# Patient Record
Sex: Male | Born: 1950 | Race: White | Hispanic: No | Marital: Single | Smoking: Former smoker
Health system: Southern US, Community
[De-identification: ages and names within clinical notes are randomized; demographics above are authoritative.]

## PROBLEM LIST (undated history)

## (undated) DIAGNOSIS — J309 Allergic rhinitis, unspecified: Secondary | ICD-10-CM

## (undated) DIAGNOSIS — T7840XA Allergy, unspecified, initial encounter: Secondary | ICD-10-CM

## (undated) DIAGNOSIS — C61 Malignant neoplasm of prostate: Secondary | ICD-10-CM

## (undated) DIAGNOSIS — R972 Elevated prostate specific antigen [PSA]: Secondary | ICD-10-CM

## (undated) DIAGNOSIS — C44311 Basal cell carcinoma of skin of nose: Secondary | ICD-10-CM

## (undated) DIAGNOSIS — K219 Gastro-esophageal reflux disease without esophagitis: Secondary | ICD-10-CM

## (undated) DIAGNOSIS — M5030 Other cervical disc degeneration, unspecified cervical region: Secondary | ICD-10-CM

## (undated) DIAGNOSIS — E559 Vitamin D deficiency, unspecified: Secondary | ICD-10-CM

## (undated) DIAGNOSIS — E669 Obesity, unspecified: Secondary | ICD-10-CM

## (undated) DIAGNOSIS — I10 Essential (primary) hypertension: Secondary | ICD-10-CM

## (undated) DIAGNOSIS — Z87891 Personal history of nicotine dependence: Secondary | ICD-10-CM

## (undated) DIAGNOSIS — F419 Anxiety disorder, unspecified: Secondary | ICD-10-CM

## (undated) DIAGNOSIS — E782 Mixed hyperlipidemia: Secondary | ICD-10-CM

## (undated) DIAGNOSIS — M5412 Radiculopathy, cervical region: Secondary | ICD-10-CM

## (undated) DIAGNOSIS — G610 Guillain-Barre syndrome: Secondary | ICD-10-CM

## (undated) DIAGNOSIS — R202 Paresthesia of skin: Secondary | ICD-10-CM

## (undated) HISTORY — DX: Elevated prostate specific antigen (PSA): R97.20

## (undated) HISTORY — DX: Essential (primary) hypertension: I10

## (undated) HISTORY — PX: TONSILLECTOMY: SUR1361

## (undated) HISTORY — DX: Guillain-Barre syndrome: G61.0

## (undated) HISTORY — DX: Basal cell carcinoma of skin of nose: C44.311

## (undated) HISTORY — DX: Gastro-esophageal reflux disease without esophagitis: K21.9

## (undated) HISTORY — DX: Personal history of nicotine dependence: Z87.891

## (undated) HISTORY — DX: Malignant neoplasm of prostate: C61

## (undated) HISTORY — DX: Allergic rhinitis, unspecified: J30.9

## (undated) HISTORY — DX: Allergy, unspecified, initial encounter: T78.40XA

## (undated) HISTORY — DX: Paresthesia of skin: R20.2

## (undated) HISTORY — DX: Radiculopathy, cervical region: M54.12

## (undated) HISTORY — DX: Other cervical disc degeneration, unspecified cervical region: M50.30

## (undated) HISTORY — DX: Obesity, unspecified: E66.9

## (undated) HISTORY — DX: Vitamin D deficiency, unspecified: E55.9

## (undated) HISTORY — DX: Mixed hyperlipidemia: E78.2

## (undated) HISTORY — DX: Anxiety disorder, unspecified: F41.9

---

## 1993-10-31 DIAGNOSIS — G610 Guillain-Barre syndrome: Secondary | ICD-10-CM

## 1993-10-31 HISTORY — DX: Guillain-Barre syndrome: G61.0

## 2003-05-26 ENCOUNTER — Encounter: Payer: Self-pay | Admitting: Internal Medicine

## 2003-05-26 ENCOUNTER — Ambulatory Visit (HOSPITAL_COMMUNITY): Admission: RE | Admit: 2003-05-26 | Discharge: 2003-05-26 | Payer: Self-pay | Admitting: Internal Medicine

## 2004-10-31 HISTORY — PX: COLONOSCOPY: SHX174

## 2009-11-13 ENCOUNTER — Ambulatory Visit: Payer: Self-pay | Admitting: Internal Medicine

## 2009-11-13 DIAGNOSIS — J309 Allergic rhinitis, unspecified: Secondary | ICD-10-CM | POA: Insufficient documentation

## 2009-11-13 DIAGNOSIS — I10 Essential (primary) hypertension: Secondary | ICD-10-CM | POA: Insufficient documentation

## 2009-11-13 DIAGNOSIS — K219 Gastro-esophageal reflux disease without esophagitis: Secondary | ICD-10-CM | POA: Insufficient documentation

## 2009-11-13 DIAGNOSIS — E7800 Pure hypercholesterolemia, unspecified: Secondary | ICD-10-CM

## 2009-11-13 LAB — CONVERTED CEMR LAB
ALT: 33 units/L (ref 0–53)
Albumin: 4.3 g/dL (ref 3.5–5.2)
Alkaline Phosphatase: 40 units/L (ref 39–117)
HDL goal, serum: 40 mg/dL
LDL Cholesterol: 105 mg/dL — ABNORMAL HIGH (ref 0–99)
Total CHOL/HDL Ratio: 4
VLDL: 10.8 mg/dL (ref 0.0–40.0)

## 2009-11-16 ENCOUNTER — Encounter: Payer: Self-pay | Admitting: Internal Medicine

## 2009-11-17 ENCOUNTER — Telehealth (INDEPENDENT_AMBULATORY_CARE_PROVIDER_SITE_OTHER): Payer: Self-pay | Admitting: *Deleted

## 2010-05-07 ENCOUNTER — Telehealth: Payer: Self-pay | Admitting: Internal Medicine

## 2010-06-14 ENCOUNTER — Ambulatory Visit: Payer: Self-pay | Admitting: Internal Medicine

## 2010-06-14 LAB — CONVERTED CEMR LAB
ALT: 29 units/L (ref 0–53)
AST: 21 units/L (ref 0–37)
Albumin: 4.1 g/dL (ref 3.5–5.2)
Alkaline Phosphatase: 45 units/L (ref 39–117)
BUN: 12 mg/dL (ref 6–23)
Basophils Absolute: 0 10*3/uL (ref 0.0–0.1)
Basophils Relative: 0.4 % (ref 0.0–3.0)
CO2: 28 meq/L (ref 19–32)
Cholesterol: 162 mg/dL (ref 0–200)
HDL: 33.6 mg/dL — ABNORMAL LOW (ref >39.00)
Hemoglobin: 14.8 g/dL (ref 13.0–17.0)
MCHC: 35.5 g/dL (ref 30.0–36.0)
MCV: 88.8 fL (ref 78.0–100.0)
Platelets: 197 10*3/uL (ref 150.0–400.0)
Potassium: 4.3 meq/L (ref 3.5–5.1)
RBC: 4.71 M/uL (ref 4.22–5.81)
RDW: 12.7 % (ref 11.5–14.6)
Sodium: 139 meq/L (ref 135–145)
TSH: 0.98 microintl units/mL (ref 0.35–5.50)
Total Bilirubin: 0.6 mg/dL (ref 0.3–1.2)
Total CHOL/HDL Ratio: 5
Triglycerides: 209 mg/dL — ABNORMAL HIGH (ref 0.0–149.0)

## 2010-11-08 ENCOUNTER — Telehealth: Payer: Self-pay | Admitting: Internal Medicine

## 2010-12-02 NOTE — Assessment & Plan Note (Signed)
Summary: NEW / [employer] / # / CD   Vital Signs:  Patient profile:   60 year old male Height:      71 inches Weight:      219 pounds BMI:     30.65 O2 Sat:      96 % on Room air Temp:     97.2 degrees F oral Pulse rate:   68 / minute Pulse rhythm:   regular Resp:     16 per minute BP sitting:   126 / 82  (left arm) Cuff size:   large  Vitals Entered By: Rock Nephew CMA (November 13, 2009 9:06 AM)  Nutrition Counseling: Patient's BMI is greater than 25 and therefore counseled on weight management options.  O2 Flow:  Room air  Primary Care Provider:  Etta Grandchild MD   History of Present Illness: "Jesus Burke" is new to me and wants to establish primary care. He had a complete physical with Dr. Earlean Polka in [place] 6 months ago.  Hypertension History:      He denies headache, chest pain, palpitations, dyspnea with exertion, orthopnea, peripheral edema, visual symptoms, neurologic problems, syncope, and side effects from treatment.  He notes no problems with any antihypertensive medication side effects.        Positive major cardiovascular risk factors include male age 10 years old or older, hyperlipidemia, and hypertension.  Negative major cardiovascular risk factors include negative family history for ischemic heart disease and non-tobacco-user status.        Further assessment for target organ damage reveals no history of ASHD, cardiac end-organ damage (CHF/LVH), stroke/TIA, peripheral vascular disease, renal insufficiency, or hypertensive retinopathy.    Lipid Management History:      Positive NCEP/ATP III risk factors include male age 15 years old or older and hypertension.  Negative NCEP/ATP III risk factors include no family history for ischemic heart disease, non-tobacco-user status, no ASHD (atherosclerotic heart disease), no prior stroke/TIA, no peripheral vascular disease, and no history of aortic aneurysm.        The patient states that he knows about the "Therapeutic Lifestyle  Change" diet.  His compliance with the TLC diet is fair.  The patient expresses understanding of adjunctive measures for cholesterol lowering.  Adjunctive measures started by the patient include aerobic exercise, fiber, ASA, omega-3 supplements, limit alcohol consumpton, and weight reduction.  He expresses no side effects from his lipid-lowering medication.  The patient denies any symptoms to suggest myopathy or liver disease.      Preventive Screening-Counseling & Management  Alcohol-Tobacco     Alcohol drinks/day: <1     Alcohol type: spirits     >5/day in last 3 mos: no     Alcohol Counseling: not indicated; use of alcohol is not excessive or problematic     Feels need to cut down: no     Feels annoyed by complaints: no     Feels guilty re: drinking: no     Needs 'eye opener' in am: no     Smoking Status: quit     Year Quit: 1985  Caffeine-Diet-Exercise     Does Patient Exercise: yes  Hep-HIV-STD-Contraception     Hepatitis Risk: no risk noted     HIV Risk: no risk noted     STD Risk: no risk noted     TSE monthly: yes     Testicular SE Education/Counseling to perform regular STE      Drug Use:  no.  Blood Transfusions:  no.    Clinical Review Panels:  Prevention   Last Colonoscopy:  Normal (11/15/2005)   Current Medications (verified): 1)  Atenolol 50 Mg Tabs (Atenolol) .... Take 1 Tablet By Mouth Two Times A Day 2)  Clorazepate Dipotassium 7.5 Mg Tabs (Clorazepate Dipotassium) .... Take 1 Tablet By Mouth Once A Day 3)  Simvastatin 40 Mg Tabs (Simvastatin) .... Take 1 Tablet By Mouth Once A Day 4)  L-Lysine 500 Mg Tabs (Lysine) .... Take 1 Tablet By Mouth Once A Day 5)  Fish Oil 6)  Omega-3 7)  Coq-10 400mg  8)  Asa 81mg  .... Take 1 Tablet By Mouth Once A Day 9)  Mature Multivitamin 10)  Vitamin C 11)  Vitamin D-3 2000 Iu .... 2 Once Daily  Allergies (verified): 1)  ! * Flu Vaccination  Past History:  Past Medical History: Allergic  rhinitis GERD Hypertension Guillian-Barre Syndrome in 1995  Past Surgical History: Tonsillectomy  Family History: Family History of Arthritis Family History High cholesterol  Social History: Retired: Optician Single Alcohol use-no Drug use-no Regular exercise-yes Smoking Status:  quit Hepatitis Risk:  no risk noted HIV Risk:  no risk noted STD Risk:  no risk noted Blood Transfusions:  no Drug Use:  no Does Patient Exercise:  yes  Review of Systems       The patient complains of weight gain.  The patient denies chest pain, syncope, dyspnea on exertion, peripheral edema, prolonged cough, headaches, hemoptysis, abdominal pain, melena, hematochezia, severe indigestion/heartburn, suspicious skin lesions, difficulty walking, and depression.    Physical Exam  General:  alert, well-developed, well-nourished, well-hydrated, appropriate dress, normal appearance, healthy-appearing, and cooperative to examination.   Eyes:  No icterus, pink moist mm Mouth:  Oral mucosa and oropharynx without lesions or exudates.  Teeth in good repair. Neck:  supple, full ROM, no masses, no thyromegaly, no JVD, normal carotid upstroke, no carotid bruits, and no cervical lymphadenopathy.   Lungs:  normal respiratory effort, no intercostal retractions, no accessory muscle use, normal breath sounds, no dullness, no fremitus, no crackles, and no wheezes.   Heart:  normal rate, regular rhythm, no murmur, no gallop, no rub, and no JVD.   Abdomen:  soft, non-tender, normal bowel sounds, no distention, no masses, no guarding, no rigidity, no hepatomegaly, and no splenomegaly.   Msk:  normal ROM, no joint tenderness, no joint swelling, no joint warmth, no redness over joints, no joint deformities, no joint instability, and no crepitation.   Pulses:  R and L carotid,radial,femoral,dorsalis pedis and posterior tibial pulses are full and equal bilaterally Extremities:  No clubbing, cyanosis, edema, or deformity noted  with normal full range of motion of all joints.   Neurologic:  No cranial nerve deficits noted. Station and gait are normal. Plantar reflexes are down-going bilaterally. DTRs are symmetrical throughout. Sensory, motor and coordinative functions appear intact. Skin:  Intact without suspicious lesions or rashes Cervical Nodes:  no anterior cervical adenopathy and no posterior cervical adenopathy.   Axillary Nodes:  no R axillary adenopathy and no L axillary adenopathy.   Inguinal Nodes:  no R inguinal adenopathy and no L inguinal adenopathy.   Psych:  Cognition and judgment appear intact. Alert and cooperative with normal attention span and concentration. No apparent delusions, illusions, hallucinations   Impression & Recommendations:  Problem # 1:  HYPERTENSION (ICD-401.9) Assessment Improved  His updated medication list for this problem includes:    Atenolol 50 Mg Tabs (Atenolol) .Marland Kitchen... Take 1 tablet by mouth  two times a day  BP today: 126/82  10 Yr Risk Heart Disease: Not enough information  Problem # 2:  PURE HYPERCHOLESTEROLEMIA (ICD-272.0) Assessment: Unchanged  His updated medication list for this problem includes:    Simvastatin 40 Mg Tabs (Simvastatin) .Marland Kitchen... Take 1 tablet by mouth once a day  Orders: Venipuncture (06237) TLB-Lipid Panel (80061-LIPID) TLB-Hepatic/Liver Function Pnl (80076-HEPATIC)  Lipid Goals: Chol Goal: 200 (11/13/2009)   HDL Goal: 40 (11/13/2009)   LDL Goal: 130 (11/13/2009)   TG Goal: 150 (11/13/2009)  10 Yr Risk Heart Disease: Not enough information  Complete Medication List: 1)  Atenolol 50 Mg Tabs (Atenolol) .... Take 1 tablet by mouth two times a day 2)  Clorazepate Dipotassium 7.5 Mg Tabs (Clorazepate dipotassium) .... Take 1 tablet by mouth once a day 3)  Simvastatin 40 Mg Tabs (Simvastatin) .... Take 1 tablet by mouth once a day 4)  L-lysine 500 Mg Tabs (Lysine) .... Take 1 tablet by mouth once a day 5)  Fish Oil  6)  Omega-3  7)  Coq-10  400mg   8)  Asa 81mg   .... Take 1 tablet by mouth once a day 9)  Mature Multivitamin  10)  Vitamin C  11)  Vitamin D-3 2000 Iu  .... 2 once daily  Hypertension Assessment/Plan:      The patient's hypertensive risk group is category B: At least one risk factor (excluding diabetes) with no target organ damage.  Today's blood pressure is 126/82.  His blood pressure goal is < 140/90.  Lipid Assessment/Plan:      Based on NCEP/ATP III, the patient's risk factor category is "2 or more risk factors and a calculated 10 year CAD risk of > 20%".  The patient's lipid goals are as follows: Total cholesterol goal is 200; LDL cholesterol goal is 130; HDL cholesterol goal is 40; Triglyceride goal is 150.    Colorectal Screening:  Colonoscopy Results:    Date of Exam: 11/15/2005    Results: Normal  PSA Screening:    Reviewed PSA screening recommendations: patient defers PSA but will reconsider in the future  Patient Instructions: 1)  Please schedule a follow-up appointment in 6 months. 2)  It is important that you exercise regularly at least 20 minutes 5 times a week. If you develop chest pain, have severe difficulty breathing, or feel very tired , stop exercising immediately and seek medical attention. 3)  You need to lose weight. Consider a lower calorie diet and regular exercise.  4)  Check your Blood Pressure regularly. If it is above 140/90: you should make an appointment. Prescriptions: CLORAZEPATE DIPOTASSIUM 7.5 MG TABS (CLORAZEPATE DIPOTASSIUM) Take 1 tablet by mouth once a day  #90 x 1   Entered and Authorized by:   Etta Grandchild MD   Signed by:   Etta Grandchild MD on 11/13/2009   Method used:   Print then Give to Patient   RxID:   [id number]     Immunization History:  Zostavax History:    Zostavax # 1:  zostavax (10/31/2006)  Not Administered:    Influenza Vaccine not given due to: patient condition

## 2010-12-02 NOTE — Letter (Signed)
Summary: Lipid Letter  [facility]  [address] [place], [place] [address]   Phone: [phone]  Fax: [phone]    06/14/2010  Jesus Burke [address] [place], [place]  [address]  Dear [name]:  We have carefully reviewed your last lipid profile from 11/13/2009 and the results are noted below with a summary of recommendations for lipid management.    Cholesterol:       162     Goal: <200   HDL "good" Cholesterol:   32.44     Goal: >40   LDL "bad" Cholesterol:   110     Goal: <130   Triglycerides:       209.0     Goal: <150 yikes!!!    other labs look good    TLC Diet (Therapeutic Lifestyle Change): Saturated Fats & Transfatty acids should be kept < 7% of total calories ***Reduce Saturated Fats Polyunstaurated Fat can be up to 10% of total calories Monounsaturated Fat Fat can be up to 20% of total calories Total Fat should be no greater than 25-35% of total calories Carbohydrates should be 50-60% of total calories Protein should be approximately 15% of total calories Fiber should be at least 20-30 grams a day ***Increased fiber may help lower LDL Total Cholesterol should be < 200mg /day Consider adding plant stanol/sterols to diet (example: Benacol spread) ***A higher intake of unsaturated fat may reduce Triglycerides and Increase HDL    Adjunctive Measures (may lower LIPIDS and reduce risk of Heart Attack) include: Aerobic Exercise (20-30 minutes 3-4 times a week) Limit Alcohol Consumption Weight Reduction Aspirin 75-81 mg a day by mouth (if not allergic or contraindicated) Dietary Fiber 20-30 grams a day by mouth     Current Medications: 1)    Atenolol 50 Mg Tabs (Atenolol) .... Take 1 tablet by mouth two times a day 2)    Clorazepate Dipotassium 7.5 Mg Tabs (Clorazepate dipotassium) .... Take 1 tablet by mouth once a day 3)    Simvastatin 40 Mg Tabs (Simvastatin) .... Take 1 tablet by mouth once a day 4)    L-lysine 500 Mg Tabs (Lysine) ....  Take 1 tablet by mouth once a day 5)    Fish Oil  6)    Omega-3  7)    Coq-10 400mg   8)    Asa 81mg   .... Take 1 tablet by mouth once a day 9)    Mature Multivitamin  10)    Vitamin C  11)    Vitamin D-3 2000 Iu  .... 2 once daily 12)    Prevacid 24hr 15 Mg Cpdr (Lansoprazole) .... Once daily  If you have any questions, please call. We appreciate being able to work with you.   Sincerely,    [facility] Etta Grandchild MD

## 2010-12-02 NOTE — Progress Notes (Signed)
Summary: RF  Phone Note Refill Request Message from:  Pharmacy  Refills Requested: Medication #1:  CLORAZEPATE DIPOTASSIUM 7.5 MG TABS Take 1 tablet by mouth once a day   Supply Requested: 6 months OK?   Initial call taken by: Lamar Sprinkles, CMA,  November 08, 2010 9:22 AM  Follow-up for Phone Call        yes Follow-up by: Etta Grandchild MD,  November 08, 2010 10:22 AM    Prescriptions: CLORAZEPATE DIPOTASSIUM 7.5 MG TABS (CLORAZEPATE DIPOTASSIUM) Take 1 tablet by mouth once a day  #90 x 1   Entered by:   Lamar Sprinkles, CMA   Authorized by:   Etta Grandchild MD   Signed by:   Lamar Sprinkles, CMA on 11/08/2010   Method used:   Telephoned to ...       [facility]* (retail)       [address] [place], [place]  [address]       Ph: [phone]       Fax: [phone]   RxID:   [id number]

## 2010-12-02 NOTE — Letter (Signed)
Summary: Lipid Letter  [facility]  [address] [place], [place] [address]   Phone: [phone]  Fax: [phone]    11/16/2009  Amiir Heckard [address] [place], [place]  [address]  Dear [name]:  We have carefully reviewed your last lipid profile from 11/13/2009 and the results are noted below with a summary of recommendations for lipid management.    Cholesterol:       153     Goal: <200   HDL "good" Cholesterol:   53.66     Goal: >40   LDL "bad" Cholesterol:   105     Goal: <130   Triglycerides:       54.0     Goal: <150    liver enzymes are normal    TLC Diet (Therapeutic Lifestyle Change): Saturated Fats & Transfatty acids should be kept < 7% of total calories ***Reduce Saturated Fats Polyunstaurated Fat can be up to 10% of total calories Monounsaturated Fat Fat can be up to 20% of total calories Total Fat should be no greater than 25-35% of total calories Carbohydrates should be 50-60% of total calories Protein should be approximately 15% of total calories Fiber should be at least 20-30 grams a day ***Increased fiber may help lower LDL Total Cholesterol should be < 200mg /day Consider adding plant stanol/sterols to diet (example: Benacol spread) ***A higher intake of unsaturated fat may reduce Triglycerides and Increase HDL    Adjunctive Measures (may lower LIPIDS and reduce risk of Heart Attack) include: Aerobic Exercise (20-30 minutes 3-4 times a week) Limit Alcohol Consumption Weight Reduction Aspirin 75-81 mg a day by mouth (if not allergic or contraindicated) Dietary Fiber 20-30 grams a day by mouth     Current Medications: 1)    Atenolol 50 Mg Tabs (Atenolol) .... Take 1 tablet by mouth two times a day 2)    Clorazepate Dipotassium 7.5 Mg Tabs (Clorazepate dipotassium) .... Take 1 tablet by mouth once a day 3)    Simvastatin 40 Mg Tabs (Simvastatin) .... Take 1 tablet by mouth once a day 4)    L-lysine 500 Mg Tabs (Lysine) .... Take 1  tablet by mouth once a day 5)    Fish Oil  6)    Omega-3  7)    Coq-10 400mg   8)    Asa 81mg   .... Take 1 tablet by mouth once a day 9)    Mature Multivitamin  10)    Vitamin C  11)    Vitamin D-3 2000 Iu  .... 2 once daily  If you have any questions, please call. We appreciate being able to work with you.   Sincerely,    [facility] Etta Grandchild MD

## 2010-12-02 NOTE — Assessment & Plan Note (Signed)
Summary: f/u appt/#/cd   Vital Signs:  Patient profile:   60 year old male Height:      71 inches Weight:      222 pounds BMI:     31.07 O2 Sat:      97 % on Room air Temp:     97.3 degrees F oral Pulse rate:   72 / minute Pulse rhythm:   regular Resp:     16 per minute BP sitting:   138 / 82  (left arm) Cuff size:   large  Vitals Entered By: Rock Nephew CMA (June 14, 2010 10:08 AM)  Nutrition Counseling: Patient's BMI is greater than 25 and therefore counseled on weight management options.  O2 Flow:  Room air CC: follow-up visit//med refill, Lipid Management Is Patient Diabetic? No Pain Assessment Patient in pain? no        Primary Care Hayes Rehfeldt:  Etta Grandchild MD  CC:  follow-up visit//med refill and Lipid Management.  History of Present Illness:  Follow-Up Visit      This is a 60 year old man who presents for Follow-up visit.  The patient denies chest pain, palpitations, dizziness, syncope, edema, SOB, DOE, PND, and orthopnea.  Since the last visit the patient notes no new problems or concerns.  The patient reports taking meds as prescribed, monitoring BP, and dietary noncompliance.  When questioned about possible medication side effects, the patient notes none.    Dyspepsia History:      He has no alarm features of dyspepsia including no history of melena, hematochezia, dysphagia, persistent vomiting, or involuntary weight loss > 5%.  There is a prior history of GERD.  The patient does not have a prior history of documented ulcer disease.  The dominant symptom is heartburn or acid reflux.  An H-2 blocker medication is currently being taken.  He notes that the symptoms have improved with the H-2 blocker therapy.  Symptoms have not persisted after 4 weeks of H-2 blocker treatment.    Lipid Management History:      Positive NCEP/ATP III risk factors include male age 58 years old or older, HDL cholesterol less than 40, and hypertension.  Negative NCEP/ATP III risk  factors include no family history for ischemic heart disease, non-tobacco-user status, no ASHD (atherosclerotic heart disease), no prior stroke/TIA, no peripheral vascular disease, and no history of aortic aneurysm.        The patient states that he knows about the "Therapeutic Lifestyle Change" diet.  His compliance with the TLC diet is fair.  The patient expresses understanding of adjunctive measures for cholesterol lowering.  Adjunctive measures started by the patient include aerobic exercise, fiber, ASA, omega-3 supplements, limit alcohol consumpton, and weight reduction.  He expresses no side effects from his lipid-lowering medication.  The patient denies any symptoms to suggest myopathy or liver disease.      Preventive Screening-Counseling & Management  Alcohol-Tobacco     Alcohol drinks/day: <1     Alcohol type: spirits     >5/day in last 3 mos: no     Alcohol Counseling: not indicated; use of alcohol is not excessive or problematic     Feels need to cut down: no     Feels annoyed by complaints: no     Feels guilty re: drinking: no     Needs 'eye opener' in am: no     Smoking Status: quit     Year Quit: 1985     Tobacco Counseling: to remain  off tobacco products  Caffeine-Diet-Exercise     Does Patient Exercise: yes  Hep-HIV-STD-Contraception     Hepatitis Risk: no risk noted     HIV Risk: no risk noted     STD Risk: no risk noted     TSE monthly: yes     Testicular SE Education/Counseling to perform regular STE      Drug Use:  no.        Blood Transfusions:  no.    Clinical Review Panels:  Lipid Management   Cholesterol:  153 (11/13/2009)   LDL (bad choesterol):  105 (11/13/2009)   HDL (good cholesterol):  37.50 (11/13/2009)  Complete Metabolic Panel   Albumin:  4.3 (11/13/2009)   Total Protein:  6.9 (11/13/2009)   Total Bili:  0.5 (11/13/2009)   Alk Phos:  40 (11/13/2009)   SGPT (ALT):  33 (11/13/2009)   SGOT (AST):  26 (11/13/2009)   Medications Prior to  Update: 1)  Atenolol 50 Mg Tabs (Atenolol) .... Take 1 Tablet By Mouth Two Times A Day 2)  Clorazepate Dipotassium 7.5 Mg Tabs (Clorazepate Dipotassium) .... Take 1 Tablet By Mouth Once A Day 3)  Simvastatin 40 Mg Tabs (Simvastatin) .... Take 1 Tablet By Mouth Once A Day 4)  L-Lysine 500 Mg Tabs (Lysine) .... Take 1 Tablet By Mouth Once A Day 5)  Fish Oil 6)  Omega-3 7)  Coq-10 400mg  8)  Asa 81mg  .... Take 1 Tablet By Mouth Once A Day 9)  Mature Multivitamin 10)  Vitamin C 11)  Vitamin D-3 2000 Iu .... 2 Once Daily  Current Medications (verified): 1)  Atenolol 50 Mg Tabs (Atenolol) .... Take 1 Tablet By Mouth Two Times A Day 2)  Clorazepate Dipotassium 7.5 Mg Tabs (Clorazepate Dipotassium) .... Take 1 Tablet By Mouth Once A Day 3)  Simvastatin 40 Mg Tabs (Simvastatin) .... Take 1 Tablet By Mouth Once A Day 4)  L-Lysine 500 Mg Tabs (Lysine) .... Take 1 Tablet By Mouth Once A Day 5)  Fish Oil 6)  Omega-3 7)  Coq-10 400mg  8)  Asa 81mg  .... Take 1 Tablet By Mouth Once A Day 9)  Mature Multivitamin 10)  Vitamin C 11)  Vitamin D-3 2000 Iu .... 2 Once Daily  Allergies (verified): 1)  ! * Flu Vaccination  Past History:  Past Medical History: Last updated: 11/13/2009 Allergic rhinitis GERD Hypertension Guillian-Barre Syndrome in 1995  Past Surgical History: Last updated: 11/13/2009 Tonsillectomy  Family History: Last updated: 11/13/2009 Family History of Arthritis Family History High cholesterol  Social History: Last updated: 06/14/2010 occupation: Optician Single Alcohol use-no Drug use-no Regular exercise-yes  Risk Factors: Alcohol Use: <1 (06/14/2010) >5 drinks/d w/in last 3 months: no (06/14/2010) Exercise: yes (06/14/2010)  Risk Factors: Smoking Status: quit (06/14/2010)  Family History: Reviewed history from 11/13/2009 and no changes required. Family History of Arthritis Family History High cholesterol  Social History: Reviewed history from 11/13/2009  and no changes required. occupation: Optician Single Alcohol use-no Drug use-no Regular exercise-yes  Review of Systems  The patient denies anorexia, fever, weight loss, weight gain, chest pain, syncope, abdominal pain, melena, hematochezia, severe indigestion/heartburn, hematuria, suspicious skin lesions, difficulty walking, depression, angioedema, and testicular masses.    Physical Exam  General:  alert, well-developed, well-nourished, well-hydrated, appropriate dress, normal appearance, healthy-appearing, and cooperative to examination.   Head:  normocephalic, atraumatic, no abnormalities observed, and no abnormalities palpated.   Mouth:  Oral mucosa and oropharynx without lesions or exudates.  Teeth in good repair. Neck:  supple, full ROM, no masses, no thyromegaly, no JVD, normal carotid upstroke, no carotid bruits, and no cervical lymphadenopathy.   Lungs:  normal respiratory effort, no intercostal retractions, no accessory muscle use, normal breath sounds, no dullness, no fremitus, no crackles, and no wheezes.   Heart:  normal rate, regular rhythm, no murmur, no gallop, no rub, and no JVD.   Abdomen:  soft, non-tender, normal bowel sounds, no distention, no masses, no guarding, no rigidity, no hepatomegaly, and no splenomegaly.   Msk:  normal ROM, no joint tenderness, no joint swelling, no joint warmth, no redness over joints, no joint deformities, no joint instability, and no crepitation.   Pulses:  R and L carotid,radial,femoral,dorsalis pedis and posterior tibial pulses are full and equal bilaterally Extremities:  No clubbing, cyanosis, edema, or deformity noted with normal full range of motion of all joints.   Neurologic:  No cranial nerve deficits noted. Station and gait are normal. Plantar reflexes are down-going bilaterally. DTRs are symmetrical throughout. Sensory, motor and coordinative functions appear intact. Skin:  Intact without suspicious lesions or rashes Cervical  Nodes:  no anterior cervical adenopathy and no posterior cervical adenopathy.   Psych:  Cognition and judgment appear intact. Alert and cooperative with normal attention span and concentration. No apparent delusions, illusions, hallucinations   Impression & Recommendations:  Problem # 1:  HYPERTENSION (ICD-401.9) Assessment Improved  His updated medication list for this problem includes:    Atenolol 50 Mg Tabs (Atenolol) .Marland Kitchen... Take 1 tablet by mouth two times a day  Orders: Venipuncture (41324) TLB-Lipid Panel (80061-LIPID) TLB-CBC Platelet - w/Differential (85025-CBCD) TLB-Hepatic/Liver Function Pnl (80076-HEPATIC) TLB-TSH (Thyroid Stimulating Hormone) (84443-TSH) TLB-BMP (Basic Metabolic Panel-BMET) (80048-METABOL)  BP today: 138/82 Prior BP: 126/82 (11/13/2009)  10 Yr Risk Heart Disease: 14 % Prior 10 Yr Risk Heart Disease: Not enough information (11/13/2009)  Labs Reviewed: Chol: 153 (11/13/2009)   HDL: 37.50 (11/13/2009)   LDL: 105 (11/13/2009)   TG: 54.0 (11/13/2009)  Problem # 2:  GERD (ICD-530.81) Assessment: Improved  Orders: Venipuncture (40102) TLB-Lipid Panel (80061-LIPID) TLB-CBC Platelet - w/Differential (85025-CBCD) TLB-Hepatic/Liver Function Pnl (80076-HEPATIC) TLB-TSH (Thyroid Stimulating Hormone) (84443-TSH) TLB-BMP (Basic Metabolic Panel-BMET) (80048-METABOL)  His updated medication list for this problem includes:    Prevacid 24hr 15 Mg Cpdr (Lansoprazole) ..... Once daily  Problem # 3:  PURE HYPERCHOLESTEROLEMIA (ICD-272.0) Assessment: Unchanged  His updated medication list for this problem includes:    Simvastatin 40 Mg Tabs (Simvastatin) .Marland Kitchen... Take 1 tablet by mouth once a day  Orders: Venipuncture (72536) TLB-Lipid Panel (80061-LIPID) TLB-CBC Platelet - w/Differential (85025-CBCD) TLB-Hepatic/Liver Function Pnl (80076-HEPATIC) TLB-TSH (Thyroid Stimulating Hormone) (84443-TSH) TLB-BMP (Basic Metabolic Panel-BMET) (80048-METABOL)  Labs  Reviewed: SGOT: 26 (11/13/2009)   SGPT: 33 (11/13/2009)  Lipid Goals: Chol Goal: 200 (11/13/2009)   HDL Goal: 40 (11/13/2009)   LDL Goal: 130 (11/13/2009)   TG Goal: 150 (11/13/2009)  10 Yr Risk Heart Disease: 14 % Prior 10 Yr Risk Heart Disease: Not enough information (11/13/2009)   HDL:37.50 (11/13/2009)  LDL:105 (11/13/2009)  Chol:153 (11/13/2009)  Trig:54.0 (11/13/2009)  Complete Medication List: 1)  Atenolol 50 Mg Tabs (Atenolol) .... Take 1 tablet by mouth two times a day 2)  Clorazepate Dipotassium 7.5 Mg Tabs (Clorazepate dipotassium) .... Take 1 tablet by mouth once a day 3)  Simvastatin 40 Mg Tabs (Simvastatin) .... Take 1 tablet by mouth once a day 4)  L-lysine 500 Mg Tabs (Lysine) .... Take 1 tablet by mouth once a day 5)  Fish Oil  6)  Omega-3  7)  Coq-10 400mg   8)  Asa 81mg   .... Take 1 tablet by mouth once a day 9)  Mature Multivitamin  10)  Vitamin C  11)  Vitamin D-3 2000 Iu  .... 2 once daily 12)  Prevacid 24hr 15 Mg Cpdr (Lansoprazole) .... Once daily  Lipid Assessment/Plan:      Based on NCEP/ATP III, the patient's risk factor category is "2 or more risk factors and a calculated 10 year CAD risk of < 20%".  The patient's lipid goals are as follows: Total cholesterol goal is 200; LDL cholesterol goal is 130; HDL cholesterol goal is 40; Triglyceride goal is 150.    Patient Instructions: 1)  Please schedule a follow-up appointment in 6 months. 2)  It is important that you exercise regularly at least 20 minutes 5 times a week. If you develop chest pain, have severe difficulty breathing, or feel very tired , stop exercising immediately and seek medical attention. 3)  You need to lose weight. Consider a lower calorie diet and regular exercise.  4)  Check your Blood Pressure regularly. If it is above 140/90: you should make an appointment. Prescriptions: SIMVASTATIN 40 MG TABS (SIMVASTATIN) Take 1 tablet by mouth once a day  #90 x 3   Entered and Authorized by:   Etta Grandchild MD   Signed by:   Etta Grandchild MD on 06/14/2010   Method used:   Electronically to        [facility]* (retail)       [address] [place], [place]  [address]       Ph: [phone]       Fax: [phone]   RxID:   [id number]

## 2010-12-02 NOTE — Progress Notes (Signed)
Summary: REFILL   Phone Note Refill Request   Refills Requested: Medication #1:  CLORAZEPATE DIPOTASSIUM 7.5 MG TABS Take 1 tablet by mouth once a day 90 day supply, [facility]  Initial call taken by: Lamar Sprinkles, CMA,  May 07, 2010 1:31 PM  Follow-up for Phone Call        ok to fill as before Follow-up by: Newt Lukes MD,  May 07, 2010 4:16 PM  Additional Follow-up for Phone Call Additional follow up Details #1::        Pt informed  Additional Follow-up by: Lamar Sprinkles, CMA,  May 07, 2010 4:46 PM    Prescriptions: CLORAZEPATE DIPOTASSIUM 7.5 MG TABS (CLORAZEPATE DIPOTASSIUM) Take 1 tablet by mouth once a day  #90 x 1   Entered by:   Lamar Sprinkles, CMA   Authorized by:   Etta Grandchild MD   Signed by:   Lamar Sprinkles, CMA on 05/07/2010   Method used:   Telephoned to ...       [facility]* (retail)       [address] [place], [place]  [address]       Ph: [phone]       Fax: [phone]   RxID:   [id number]

## 2010-12-02 NOTE — Progress Notes (Signed)
Summary: Medical authorization received  Medical authorization received and faxed to Dr. Horris Latino @ [phone]. Wilder Glade  November 17, 2009 4:41 PM

## 2011-04-18 ENCOUNTER — Telehealth: Payer: Self-pay

## 2011-04-18 NOTE — Telephone Encounter (Signed)
Needs to be seen

## 2011-04-18 NOTE — Telephone Encounter (Signed)
Patient called [facility] requesting refill on clorazepate.  Patient last seen 06/14/10 and rx last given 11/18/10 #90/1rf. Is this ok to refill

## 2011-04-19 ENCOUNTER — Other Ambulatory Visit: Payer: Self-pay | Admitting: Internal Medicine

## 2011-04-20 NOTE — Telephone Encounter (Signed)
appt set up for pt/closing phone note

## 2011-04-21 ENCOUNTER — Ambulatory Visit (INDEPENDENT_AMBULATORY_CARE_PROVIDER_SITE_OTHER): Payer: Self-pay | Admitting: Internal Medicine

## 2011-04-21 ENCOUNTER — Encounter: Payer: Self-pay | Admitting: Internal Medicine

## 2011-04-21 VITALS — BP 124/78 | HR 79 | Temp 97.8°F | Resp 16 | Wt 216.0 lb

## 2011-04-21 DIAGNOSIS — F419 Anxiety disorder, unspecified: Secondary | ICD-10-CM | POA: Insufficient documentation

## 2011-04-21 DIAGNOSIS — F411 Generalized anxiety disorder: Secondary | ICD-10-CM

## 2011-04-21 DIAGNOSIS — I10 Essential (primary) hypertension: Secondary | ICD-10-CM

## 2011-04-21 DIAGNOSIS — Z23 Encounter for immunization: Secondary | ICD-10-CM

## 2011-04-21 DIAGNOSIS — J0100 Acute maxillary sinusitis, unspecified: Secondary | ICD-10-CM

## 2011-04-21 DIAGNOSIS — E7800 Pure hypercholesterolemia, unspecified: Secondary | ICD-10-CM

## 2011-04-21 DIAGNOSIS — K219 Gastro-esophageal reflux disease without esophagitis: Secondary | ICD-10-CM

## 2011-04-21 MED ORDER — CLORAZEPATE DIPOTASSIUM 7.5 MG PO TABS: 7.5000 mg | ORAL_TABLET | Freq: Two times a day (BID) | ORAL | Status: DC | PRN

## 2011-04-21 MED ORDER — PSEUDOEPH-CHLORPHEN-HYDROCOD 60-4-5 MG/5ML PO SOLN: 5.0000 mL | Freq: Four times a day (QID) | ORAL | Status: DC | PRN

## 2011-04-21 MED ORDER — ATENOLOL 50 MG PO TABS: 50.0000 mg | ORAL_TABLET | Freq: Two times a day (BID) | ORAL | Status: DC

## 2011-04-21 MED ORDER — TETANUS-DIPHTH-ACELL PERTUSSIS 5-2.5-18.5 LF-MCG/0.5 IM SUSP: 0.5000 mL | Freq: Once | INTRAMUSCULAR | Status: AC

## 2011-04-21 MED ORDER — SIMVASTATIN 40 MG PO TABS: 40.0000 mg | ORAL_TABLET | Freq: Every day | ORAL | Status: DC

## 2011-04-21 MED ORDER — AMOXICILLIN 500 MG PO CAPS: 500.0000 mg | ORAL_CAPSULE | Freq: Three times a day (TID) | ORAL | Status: AC

## 2011-04-21 NOTE — Assessment & Plan Note (Signed)
Start amoxil for the infection and zutripro for symptom relief

## 2011-04-21 NOTE — Patient Instructions (Signed)
Sinusitis Sinuses are air pockets within the bones of your face. The growth of bacteria within a sinus leads to infection. Infection keeps the sinuses from draining. This infection is called sinusitis. SYMPTOMS There will be different areas of pain depending on which sinuses have become infected.  The maxillary sinuses often produce pain beneath the eyes.   Frontal sinusitis may cause pain in the middle of the forehead and above the eyes.  Other problems (symptoms) include:  Toothaches.   Colored, pus-like (purulent) drainage from the nose.   Any swelling, warmth, or tenderness over the sinus areas may be signs of infection.  TREATMENT Sinusitis is most often determined by an exam and you may have x-rays taken. If x-rays have been taken, make sure you obtain your results. Or find out how you are to obtain them. Your caregiver may give you medications (antibiotics). These are medications that will help kill the infection. You may also be given a medication (decongestant) that helps to reduce sinus swelling.  HOME CARE INSTRUCTIONS  Only take over-the-counter or prescription medicines for pain, discomfort, or fever as directed by your caregiver.   Drink extra fluids. Fluids help thin the mucus so your sinuses can drain more easily.   Applying either moist heat or ice packs to the sinus areas may help relieve discomfort.   Use saline nasal sprays to help moisten your sinuses. The sprays can be found at your local drugstore.  SEEK IMMEDIATE MEDICAL CARE IF YOU DEVELOP:  High fever that is still present after two days of antibiotic treatment.   Increasing pain, severe headaches, or toothache.   Nausea, vomiting, or drowsiness.   Unusual swelling around the face or trouble seeing.  MAKE SURE YOU:   Understand these instructions.   Will watch your condition.   Will get help right away if you are not doing well or get worse.  Document Released: 10/17/2005 Document Re-Released:  09/29/2008 [facility] Patient Information 2011 [place], [place].Hypertension (High Blood Pressure) As your heart beats, it forces blood through your arteries. This force is your blood pressure. If the pressure is too high, it is called hypertension (HTN) or high blood pressure. HTN is dangerous because you may have it and not know it. High blood pressure may mean that your heart has to work harder to pump blood. Your arteries may be narrow or stiff. The extra work puts you at risk for heart disease, stroke, and other problems.  Blood pressure consists of two numbers, a higher number over a lower, 110/72, for example. It is stated as "110 over 72." The ideal is below 120 for the top number (systolic) and under 80 for the bottom (diastolic). Write down your blood pressure today. You should pay close attention to your blood pressure if you have certain conditions such as:  Heart failure.  Prior heart attack.   Diabetes   Chronic kidney disease.   Prior stroke.   Multiple risk factors for heart disease.   To see if you have HTN, your blood pressure should be measured while you are seated with your arm held at the level of the heart. It should be measured at least twice. A one-time elevated blood pressure reading (especially in the Emergency Department) does not mean that you need treatment. There may be conditions in which the blood pressure is different between your right and left arms. It is important to see your caregiver soon for a recheck. Most people have essential hypertension which means that there is not  a specific cause. This type of high blood pressure may be lowered by changing lifestyle factors such as:  Stress.  Smoking.   Lack of exercise.   Excessive weight.  Drug/tobacco/alcohol use.   Eating less salt.   Most people do not have symptoms from high blood pressure until it has caused damage to the body. Effective treatment can often prevent, delay or reduce that  damage. TREATMENT Treatment for high blood pressure, when a cause has been identified, is directed at the cause. There are a large number of medications to treat HTN. These fall into several categories, and your caregiver will help you select the medicines that are best for you. Medications may have side effects. You should review side effects with your caregiver. If your blood pressure stays high after you have made lifestyle changes or started on medicines,   Your medication(s) may need to be changed.   Other problems may need to be addressed.   Be certain you understand your prescriptions, and know how and when to take your medicine.   Be sure to follow up with your caregiver within the time frame advised (usually within two weeks) to have your blood pressure rechecked and to review your medications.   If you are taking more than one medicine to lower your blood pressure, make sure you know how and at what times they should be taken. Taking two medicines at the same time can result in blood pressure that is too low.  SEEK IMMEDIATE MEDICAL CARE IF YOU DEVELOP:  A severe headache, blurred or changing vision, or confusion.   Unusual weakness or numbness, or a faint feeling.   Severe chest or abdominal pain, vomiting, or breathing problems.  MAKE SURE YOU:   Understand these instructions.   Will watch your condition.   Will get help right away if you are not doing well or get worse.  Document Released: 10/17/2005 Document Re-Released: 04/06/2010 [facility] Patient Information 2011 [place], [place].

## 2011-04-21 NOTE — Progress Notes (Signed)
Subjective:    Patient ID: Jesus Burke, male    DOB: May 09, 1951, 60 y.o.   MRN: [id number]  URI  The current episode started in the past 7 days. The problem has been gradually worsening. There has been no fever. Associated symptoms include congestion, coughing, rhinorrhea, sinus pain, sneezing and a sore throat. Pertinent negatives include no abdominal pain, chest pain, diarrhea, dysuria, ear pain, headaches, joint pain, joint swelling, nausea, neck pain, plugged ear sensation, rash, swollen glands, vomiting or wheezing. He has tried decongestant for the symptoms. The treatment provided no relief.  Hypertension This is a chronic problem. The current episode started more than 1 year ago. The problem has been gradually improving since onset. The problem is controlled. Pertinent negatives include no anxiety, blurred vision, chest pain, headaches, malaise/fatigue, neck pain, orthopnea, palpitations, peripheral edema, PND, shortness of breath or sweats. Agents associated with hypertension include decongestants. Past treatments include beta blockers. The current treatment provides significant improvement. There are no compliance problems.       Review of Systems  Constitutional: Negative for fever, chills, malaise/fatigue, diaphoresis, activity change, appetite change, fatigue and unexpected weight change.  HENT: Positive for congestion, sore throat, rhinorrhea, sneezing, postnasal drip and sinus pressure. Negative for hearing loss, ear pain, nosebleeds, facial swelling, drooling, mouth sores, trouble swallowing, neck pain, neck stiffness, dental problem, voice change, tinnitus and ear discharge.   Eyes: Negative.  Negative for blurred vision.  Respiratory: Positive for cough. Negative for apnea, choking, chest tightness, shortness of breath, wheezing and stridor.   Cardiovascular: Negative for chest pain, palpitations, orthopnea and PND.  Gastrointestinal: Negative for nausea, vomiting, abdominal  pain and diarrhea.  Genitourinary: Negative for dysuria.  Musculoskeletal: Negative.  Negative for joint pain.  Skin: Negative.  Negative for rash.  Neurological: Negative.  Negative for headaches.  Hematological: Negative.   Psychiatric/Behavioral: Negative for suicidal ideas, hallucinations, behavioral problems, confusion, sleep disturbance, self-injury, dysphoric mood, decreased concentration and agitation. The patient is nervous/anxious. The patient is not hyperactive.        Objective:   Physical Exam  Vitals reviewed. Constitutional: He is oriented to person, place, and time. He appears well-developed and well-nourished. No distress.  HENT:  Right Ear: Hearing, tympanic membrane, external ear and ear canal normal.  Left Ear: Hearing, tympanic membrane, external ear and ear canal normal.  Nose: Mucosal edema and rhinorrhea present. No nose lacerations, sinus tenderness, nasal deformity, septal deviation or nasal septal hematoma. No epistaxis.  No foreign bodies. Right sinus exhibits maxillary sinus tenderness. Right sinus exhibits no frontal sinus tenderness. Left sinus exhibits maxillary sinus tenderness. Left sinus exhibits no frontal sinus tenderness.  Eyes: Conjunctivae and EOM are normal. Pupils are equal, round, and reactive to light. Right eye exhibits no discharge. Left eye exhibits no discharge. No scleral icterus.  Neck: Normal range of motion. Neck supple. No JVD present. No tracheal deviation present. No thyromegaly present.  Cardiovascular: Normal rate, regular rhythm, normal heart sounds and intact distal pulses.  Exam reveals no gallop and no friction rub.   No murmur heard. Pulmonary/Chest: Effort normal and breath sounds normal. No stridor. No respiratory distress. He has no wheezes. He has no rales. He exhibits no tenderness.  Abdominal: Soft. Bowel sounds are normal. He exhibits no distension and no mass. There is no tenderness. There is no rebound and no guarding.    Musculoskeletal: Normal range of motion. He exhibits no edema and no tenderness.  Lymphadenopathy:    He has no cervical adenopathy.  Neurological: He is alert and oriented to person, place, and time. He has normal reflexes. He displays normal reflexes. No cranial nerve deficit. He exhibits normal muscle tone. Coordination normal.  Skin: Skin is warm and dry. No rash noted. He is not diaphoretic. No erythema. No pallor.  Psychiatric: He has a normal mood and affect. His behavior is normal. Judgment and thought content normal.        Lab Results  Component Value Date   WBC 6.9 06/14/2010   HGB 14.8 06/14/2010   HCT 41.8 06/14/2010   PLT 197.0 06/14/2010   CHOL 162 06/14/2010   TRIG 209.0* 06/14/2010   HDL 33.60* 06/14/2010   LDLDIRECT 109.8 06/14/2010   ALT 29 06/14/2010   AST 21 06/14/2010   NA 139 06/14/2010   K 4.3 06/14/2010   CL 104 06/14/2010   CREATININE 0.8 06/14/2010   BUN 12 06/14/2010   CO2 28 06/14/2010   TSH 0.98 06/14/2010    Assessment & Plan:

## 2011-04-30 NOTE — Assessment & Plan Note (Signed)
BP is well controlled 

## 2011-04-30 NOTE — Assessment & Plan Note (Signed)
Continue tranxene prn

## 2012-01-06 ENCOUNTER — Other Ambulatory Visit: Payer: Self-pay | Admitting: Internal Medicine

## 2012-01-06 DIAGNOSIS — F419 Anxiety disorder, unspecified: Secondary | ICD-10-CM

## 2012-01-06 NOTE — Telephone Encounter (Signed)
The pt called and is hoping to get a refill clorazepate (Tranxene) 7.5mg .  He is willing to make an office visit to get a refill, but he has been laid off and no longer has insurance.  He wanted to ask to see if he could get the refill while he works on putting together the cash for an office visit.    Thanks!

## 2012-01-09 MED ORDER — CLORAZEPATE DIPOTASSIUM 7.5 MG PO TABS: 7.5000 mg | ORAL_TABLET | Freq: Two times a day (BID) | ORAL | Status: DC | PRN

## 2012-01-09 NOTE — Telephone Encounter (Signed)
RX sent, pt notified//lmovm

## 2012-01-09 NOTE — Telephone Encounter (Signed)
ok 

## 2012-01-13 ENCOUNTER — Other Ambulatory Visit: Payer: Self-pay | Admitting: Internal Medicine

## 2012-01-14 ENCOUNTER — Other Ambulatory Visit: Payer: Self-pay | Admitting: Internal Medicine

## 2012-05-07 ENCOUNTER — Other Ambulatory Visit: Payer: Self-pay | Admitting: Internal Medicine

## 2012-06-27 ENCOUNTER — Other Ambulatory Visit (INDEPENDENT_AMBULATORY_CARE_PROVIDER_SITE_OTHER): Payer: Self-pay

## 2012-06-27 ENCOUNTER — Encounter: Payer: Self-pay | Admitting: Internal Medicine

## 2012-06-27 ENCOUNTER — Ambulatory Visit (INDEPENDENT_AMBULATORY_CARE_PROVIDER_SITE_OTHER): Payer: Self-pay | Admitting: Internal Medicine

## 2012-06-27 VITALS — BP 140/88 | HR 72 | Temp 98.5°F | Resp 16 | Wt 222.0 lb

## 2012-06-27 DIAGNOSIS — R0683 Snoring: Secondary | ICD-10-CM | POA: Insufficient documentation

## 2012-06-27 DIAGNOSIS — Z0000 Encounter for general adult medical examination without abnormal findings: Secondary | ICD-10-CM

## 2012-06-27 DIAGNOSIS — E7800 Pure hypercholesterolemia, unspecified: Secondary | ICD-10-CM

## 2012-06-27 DIAGNOSIS — K219 Gastro-esophageal reflux disease without esophagitis: Secondary | ICD-10-CM

## 2012-06-27 DIAGNOSIS — R0609 Other forms of dyspnea: Secondary | ICD-10-CM

## 2012-06-27 DIAGNOSIS — F411 Generalized anxiety disorder: Secondary | ICD-10-CM

## 2012-06-27 DIAGNOSIS — I10 Essential (primary) hypertension: Secondary | ICD-10-CM

## 2012-06-27 DIAGNOSIS — R002 Palpitations: Secondary | ICD-10-CM | POA: Insufficient documentation

## 2012-06-27 DIAGNOSIS — F419 Anxiety disorder, unspecified: Secondary | ICD-10-CM

## 2012-06-27 LAB — CBC WITH DIFFERENTIAL/PLATELET
Basophils Absolute: 0 10*3/uL (ref 0.0–0.1)
Eosinophils Absolute: 0.1 10*3/uL (ref 0.0–0.7)
Lymphocytes Relative: 22.1 % (ref 12.0–46.0)
MCHC: 33.7 g/dL (ref 30.0–36.0)
Monocytes Relative: 7.9 % (ref 3.0–12.0)
Neutro Abs: 5 10*3/uL (ref 1.4–7.7)
Neutrophils Relative %: 67.8 % (ref 43.0–77.0)
Platelets: 188 10*3/uL (ref 150.0–400.0)
RDW: 13.1 % (ref 11.5–14.6)

## 2012-06-27 LAB — COMPREHENSIVE METABOLIC PANEL
ALT: 34 U/L (ref 0–53)
AST: 25 U/L (ref 0–37)
Albumin: 4.2 g/dL (ref 3.5–5.2)
CO2: 28 mEq/L (ref 19–32)
Calcium: 9.4 mg/dL (ref 8.4–10.5)
Chloride: 104 mEq/L (ref 96–112)
Creatinine, Ser: 1 mg/dL (ref 0.4–1.5)
GFR: 85.26 mL/min (ref >60.00)
Potassium: 4.6 mEq/L (ref 3.5–5.1)
Sodium: 138 mEq/L (ref 135–145)
Total Protein: 6.8 g/dL (ref 6.0–8.3)

## 2012-06-27 LAB — URINALYSIS, ROUTINE W REFLEX MICROSCOPIC
Bilirubin Urine: NEGATIVE
Hgb urine dipstick: NEGATIVE
Ketones, ur: NEGATIVE
Leukocytes, UA: NEGATIVE
Nitrite: NEGATIVE
Urobilinogen, UA: 0.2 (ref 0.0–1.0)

## 2012-06-27 LAB — LIPID PANEL
HDL: 31.8 mg/dL — ABNORMAL LOW (ref >39.00)
LDL Cholesterol: 86 mg/dL (ref 0–99)
Total CHOL/HDL Ratio: 4
VLDL: 24.8 mg/dL (ref 0.0–40.0)

## 2012-06-27 LAB — PSA: PSA: 3.28 ng/mL (ref 0.10–4.00)

## 2012-06-27 LAB — TSH: TSH: 1.14 u[IU]/mL (ref 0.35–5.50)

## 2012-06-27 MED ORDER — CLORAZEPATE DIPOTASSIUM 7.5 MG PO TABS: 7.5000 mg | ORAL_TABLET | Freq: Three times a day (TID) | ORAL | Status: DC

## 2012-06-27 NOTE — Progress Notes (Signed)
Subjective:    Patient ID: Jesus Burke, male    DOB: 06-01-1951, 61 y.o.   MRN: [id number]  Palpitations  This is a new problem. The current episode started more than 1 month ago. The problem occurs intermittently. The problem has been unchanged. On average, each episode lasts 1 minute. The symptoms are aggravated by stress. Associated symptoms include anxiety. Pertinent negatives include no chest fullness, chest pain, coughing, diaphoresis, dizziness, fever, irregular heartbeat, malaise/fatigue, nausea, near-syncope, numbness, shortness of breath, syncope, vomiting or weakness. He has tried anxiolytics for the symptoms. The treatment provided significant relief. His past medical history is significant for anxiety. There is no history of anemia, drug use, heart disease, hyperthyroidism or a valve disorder.      Review of Systems  Constitutional: Positive for unexpected weight change (weight gain). Negative for fever, chills, malaise/fatigue, diaphoresis, activity change, appetite change and fatigue.  HENT: Negative.   Eyes: Negative.   Respiratory: Positive for apnea (and heavy snoring). Negative for cough, choking, chest tightness, shortness of breath, wheezing and stridor.   Cardiovascular: Positive for palpitations. Negative for chest pain, leg swelling, syncope and near-syncope.  Gastrointestinal: Negative.  Negative for nausea, vomiting, abdominal pain, diarrhea, constipation and blood in stool.  Genitourinary: Negative.  Negative for urgency, decreased urine volume, discharge, penile swelling, scrotal swelling and testicular pain.  Musculoskeletal: Negative.   Skin: Negative.   Neurological: Negative.  Negative for dizziness, weakness and numbness.  Hematological: Negative for adenopathy. Does not bruise/bleed easily.  Psychiatric/Behavioral: Positive for disturbed wake/sleep cycle. Negative for suicidal ideas, hallucinations, behavioral problems, confusion, self-injury, dysphoric  mood, decreased concentration and agitation. The patient is nervous/anxious. The patient is not hyperactive.        Objective:   Physical Exam  Vitals reviewed. Constitutional: He is oriented to person, place, and time. He appears well-developed and well-nourished. No distress.  HENT:  Head: Normocephalic and atraumatic.  Mouth/Throat: Oropharynx is clear and moist. No oropharyngeal exudate.  Eyes: Conjunctivae are normal. Right eye exhibits no discharge. Left eye exhibits no discharge. No scleral icterus.  Neck: Normal range of motion. Neck supple. No JVD present. No tracheal deviation present. No thyromegaly present.  Cardiovascular: Normal rate, regular rhythm, normal heart sounds and intact distal pulses.  Exam reveals no gallop and no friction rub.   No murmur heard. Pulmonary/Chest: Effort normal and breath sounds normal. No stridor. No respiratory distress. He has no wheezes. He has no rales. He exhibits no tenderness.  Abdominal: Soft. Bowel sounds are normal. He exhibits no distension and no mass. There is no tenderness. There is no rebound and no guarding. Hernia confirmed negative in the right inguinal area and confirmed negative in the left inguinal area.  Genitourinary: Rectum normal, prostate normal, testes normal and penis normal. Rectal exam shows no external hemorrhoid, no internal hemorrhoid, no fissure, no mass, no tenderness and anal tone normal. Guaiac negative stool. Prostate is not enlarged and not tender. Right testis shows no mass, no swelling and no tenderness. Right testis is descended. Left testis shows no mass, no swelling and no tenderness. Left testis is descended. Circumcised. No penile erythema or penile tenderness. No discharge found.  Musculoskeletal: Normal range of motion. He exhibits no edema and no tenderness.  Lymphadenopathy:    He has no cervical adenopathy.       Right: No inguinal adenopathy present.       Left: No inguinal adenopathy present.    Neurological: He is oriented to person, place, and time.  Skin: Skin is warm and dry. No rash noted. He is not diaphoretic. No erythema. No pallor.  Psychiatric: He has a normal mood and affect. His behavior is normal. Judgment and thought content normal.      Lab Results  Component Value Date   WBC 7.4 06/27/2012   HGB 15.4 06/27/2012   HCT 45.6 06/27/2012   PLT 188.0 06/27/2012   GLUCOSE 93 06/27/2012   CHOL 143 06/27/2012   TRIG 124.0 06/27/2012   HDL 31.80* 06/27/2012   LDLDIRECT 109.8 06/14/2010   LDLCALC 86 06/27/2012   ALT 34 06/27/2012   AST 25 06/27/2012   NA 138 06/27/2012   K 4.6 06/27/2012   CL 104 06/27/2012   CREATININE 1.0 06/27/2012   BUN 13 06/27/2012   CO2 28 06/27/2012   TSH 1.14 06/27/2012   PSA 3.28 06/27/2012      Assessment & Plan:

## 2012-06-27 NOTE — Assessment & Plan Note (Signed)
His BP is well controlled 

## 2012-06-27 NOTE — Assessment & Plan Note (Signed)
His EKG is normal and he has no danger signs or symptoms, I will check his labs to look for secondary causes

## 2012-06-27 NOTE — Assessment & Plan Note (Signed)
Continue tranxene as needed

## 2012-06-27 NOTE — Assessment & Plan Note (Signed)
I have asked him to get a sleep evaluation done

## 2012-06-27 NOTE — Assessment & Plan Note (Signed)
He is doing well on zocor, I will check his FLP today 

## 2012-06-27 NOTE — Patient Instructions (Signed)
Palpitations  A palpitation is the feeling that your heartbeat is irregular or is faster than normal. Although this is frightening, it usually is not serious. Palpitations may be caused by excesses of smoking, caffeine, or alcohol. They are also brought on by stress and anxiety. Sometimes, they are caused by heart disease. Unless otherwise noted, your caregiver did not find any signs of serious illness at this time. HOME CARE INSTRUCTIONS  To help prevent palpitations:  Drink decaffeinated coffee, tea, and soda pop. Avoid chocolate.   If you smoke or drink alcohol, quit or cut down as much as possible.   Reduce your stress or anxiety level. Biofeedback, yoga, or meditation will help you relax. Physical activity such as swimming, jogging, or walking also may be helpful.  SEEK MEDICAL CARE IF:   You continue to have a fast heartbeat.   Your palpitations occur more often.  SEEK IMMEDIATE MEDICAL CARE IF: You develop chest pain, shortness of breath, severe headache, dizziness, or fainting. Document Released: 10/14/2000 Document Revised: 10/06/2011 Document Reviewed: 12/14/2007 [facility] Patient Information 2012 [place], [place].Health Maintenance, Males A healthy lifestyle and preventative care can promote health and wellness.  Maintain regular health, dental, and eye exams.   Eat a healthy diet. Foods like vegetables, fruits, whole grains, low-fat dairy products, and lean protein foods contain the nutrients you need without too many calories. Decrease your intake of foods high in solid fats, added sugars, and salt. Get information about a proper diet from your caregiver, if necessary.   Regular physical exercise is one of the most important things you can do for your health. Most adults should get at least 150 minutes of moderate-intensity exercise (any activity that increases your heart rate and causes you to sweat) each week. In addition, most adults need muscle-strengthening exercises on 2 or  more days a week.    Maintain a healthy weight. The body mass index (BMI) is a screening tool to identify possible weight problems. It provides an estimate of body fat based on height and weight. Your caregiver can help determine your BMI, and can help you achieve or maintain a healthy weight. For adults 20 years and older:   A BMI below 18.5 is considered underweight.   A BMI of 18.5 to 24.9 is normal.   A BMI of 25 to 29.9 is considered overweight.   A BMI of 30 and above is considered obese.   Maintain normal blood lipids and cholesterol by exercising and minimizing your intake of saturated fat. Eat a balanced diet with plenty of fruits and vegetables. Blood tests for lipids and cholesterol should begin at age 44 and be repeated every 5 years. If your lipid or cholesterol levels are high, you are over 50, or you are a high risk for heart disease, you may need your cholesterol levels checked more frequently.Ongoing high lipid and cholesterol levels should be treated with medicines, if diet and exercise are not effective.   If you smoke, find out from your caregiver how to quit. If you do not use tobacco, do not start.   If you choose to drink alcohol, do not exceed 2 drinks per day. One drink is considered to be 12 ounces (355 mL) of beer, 5 ounces (148 mL) of wine, or 1.5 ounces (44 mL) of liquor.   Avoid use of street drugs. Do not share needles with anyone. Ask for help if you need support or instructions about stopping the use of drugs.   High blood pressure causes  heart disease and increases the risk of stroke. Blood pressure should be checked at least every 1 to 2 years. Ongoing high blood pressure should be treated with medicines if weight loss and exercise are not effective.   If you are 35 to 59 years old, ask your caregiver if you should take aspirin to prevent heart disease.   Diabetes screening involves taking a blood sample to check your fasting blood sugar level. This should  be done once every 3 years, after age 41, if you are within normal weight and without risk factors for diabetes. Testing should be considered at a younger age or be carried out more frequently if you are overweight and have at least 1 risk factor for diabetes.   Colorectal cancer can be detected and often prevented. Most routine colorectal cancer screening begins at the age of 23 and continues through age 54. However, your caregiver may recommend screening at an earlier age if you have risk factors for colon cancer. On a yearly basis, your caregiver may provide home test kits to check for hidden blood in the stool. Use of a small camera at the end of a tube, to directly examine the colon (sigmoidoscopy or colonoscopy), can detect the earliest forms of colorectal cancer. Talk to your caregiver about this at age 68, when routine screening begins. Direct examination of the colon should be repeated every 5 to 10 years through age 70, unless early forms of pre-cancerous polyps or small growths are found.   Hepatitis C blood testing is recommended for all people born from [birth year] through 1963 and any individual with known risks for hepatitis C.   Healthy men should no longer receive prostate-specific antigen (PSA) blood tests as part of routine cancer screening. Consult with your caregiver about prostate cancer screening.   Testicular cancer screening is not recommended for adolescents or adult males who have no symptoms. Screening includes self-exam, caregiver exam, and other screening tests. Consult with your caregiver about any symptoms you have or any concerns you have about testicular cancer.   Practice safe sex. Use condoms and avoid high-risk sexual practices to reduce the spread of sexually transmitted infections (STIs).   Use sunscreen with a sun protection factor (SPF) of 30 or greater. Apply sunscreen liberally and repeatedly throughout the day. You should seek shade when your shadow is shorter than  you. Protect yourself by wearing long sleeves, pants, a wide-brimmed hat, and sunglasses year round, whenever you are outdoors.   Notify your caregiver of new moles or changes in moles, especially if there is a change in shape or color. Also notify your caregiver if a mole is larger than the size of a pencil eraser.   A one-time screening for abdominal aortic aneurysm (AAA) and surgical repair of large AAAs by sound wave imaging (ultrasonography) is recommended for ages 31 to 21 years who are current or former smokers.   Stay current with your immunizations.  Document Released: 04/14/2008 Document Revised: 10/06/2011 Document Reviewed: 03/14/2011 [facility] Patient Information 2012 [place], [place].

## 2012-06-27 NOTE — Assessment & Plan Note (Signed)
Exam done, vaccines were reviewed, labs ordered, pt ed material was given 

## 2012-06-29 ENCOUNTER — Other Ambulatory Visit: Payer: Self-pay | Admitting: Internal Medicine

## 2012-07-04 ENCOUNTER — Other Ambulatory Visit: Payer: Self-pay | Admitting: Internal Medicine

## 2012-07-25 ENCOUNTER — Institutional Professional Consult (permissible substitution): Payer: Self-pay | Admitting: Pulmonary Disease

## 2012-11-08 ENCOUNTER — Other Ambulatory Visit: Payer: Self-pay | Admitting: Internal Medicine

## 2012-11-23 ENCOUNTER — Telehealth: Payer: Self-pay | Admitting: Internal Medicine

## 2012-11-23 ENCOUNTER — Other Ambulatory Visit: Payer: Self-pay | Admitting: Internal Medicine

## 2012-11-23 DIAGNOSIS — I10 Essential (primary) hypertension: Secondary | ICD-10-CM

## 2012-11-23 DIAGNOSIS — E7800 Pure hypercholesterolemia, unspecified: Secondary | ICD-10-CM

## 2012-11-23 DIAGNOSIS — F419 Anxiety disorder, unspecified: Secondary | ICD-10-CM

## 2012-11-23 MED ORDER — ATENOLOL 50 MG PO TABS: 50.0000 mg | ORAL_TABLET | Freq: Two times a day (BID) | ORAL | Status: DC

## 2012-11-23 MED ORDER — SIMVASTATIN 40 MG PO TABS: 40.0000 mg | ORAL_TABLET | Freq: Every day | ORAL | Status: DC

## 2012-11-23 MED ORDER — CLORAZEPATE DIPOTASSIUM 7.5 MG PO TABS: 7.5000 mg | ORAL_TABLET | Freq: Three times a day (TID) | ORAL | Status: DC

## 2012-11-23 NOTE — Telephone Encounter (Signed)
Patient needs meds fill, pt did not know he needed another appt, states he was seen in August, please Advise

## 2012-11-23 NOTE — Telephone Encounter (Signed)
Atenolol and simvastatin

## 2012-11-23 NOTE — Telephone Encounter (Signed)
?  tranxene refill?

## 2012-11-23 NOTE — Telephone Encounter (Signed)
[facility]...Jesus Burke

## 2013-06-17 ENCOUNTER — Telehealth: Payer: Self-pay

## 2013-06-17 DIAGNOSIS — F419 Anxiety disorder, unspecified: Secondary | ICD-10-CM

## 2013-06-17 DIAGNOSIS — E7800 Pure hypercholesterolemia, unspecified: Secondary | ICD-10-CM

## 2013-06-17 MED ORDER — CLORAZEPATE DIPOTASSIUM 7.5 MG PO TABS: 7.5000 mg | ORAL_TABLET | Freq: Three times a day (TID) | ORAL | Status: DC

## 2013-06-17 MED ORDER — SIMVASTATIN 40 MG PO TABS: 40.0000 mg | ORAL_TABLET | Freq: Every day | ORAL | Status: DC

## 2013-06-17 NOTE — Telephone Encounter (Signed)
yes

## 2013-06-17 NOTE — Telephone Encounter (Signed)
Called pt//lmovm advising request approved

## 2013-06-17 NOTE — Telephone Encounter (Signed)
Patient called Jesus Burke requesting 30 day supply medication refills on simvastatin and clorazepate. Patient states that he now works out of town and not able to see new MD until September. Please advise if ok to provide a 30 day supply to last pt until appt. Thanks

## 2013-07-02 ENCOUNTER — Encounter: Payer: Self-pay | Admitting: Medical

## 2013-07-05 ENCOUNTER — Encounter: Payer: Self-pay | Admitting: Medical

## 2013-07-05 ENCOUNTER — Ambulatory Visit (INDEPENDENT_AMBULATORY_CARE_PROVIDER_SITE_OTHER): Payer: BC Managed Care – PPO | Admitting: Medical

## 2013-07-05 VITALS — BP 130/88 | HR 76 | Temp 98.1°F | Resp 16 | Ht 70.0 in | Wt 219.0 lb

## 2013-07-05 DIAGNOSIS — Z8669 Personal history of other diseases of the nervous system and sense organs: Secondary | ICD-10-CM

## 2013-07-05 DIAGNOSIS — F411 Generalized anxiety disorder: Secondary | ICD-10-CM

## 2013-07-05 DIAGNOSIS — E7800 Pure hypercholesterolemia, unspecified: Secondary | ICD-10-CM

## 2013-07-05 DIAGNOSIS — E669 Obesity, unspecified: Secondary | ICD-10-CM

## 2013-07-05 DIAGNOSIS — E785 Hyperlipidemia, unspecified: Secondary | ICD-10-CM

## 2013-07-05 DIAGNOSIS — I10 Essential (primary) hypertension: Secondary | ICD-10-CM

## 2013-07-05 LAB — COMPREHENSIVE METABOLIC PANEL
Alkaline Phosphatase: 46 U/L (ref 39–117)
BUN: 11 mg/dL (ref 6–23)
CO2: 28 mEq/L (ref 19–32)
Creat: 0.84 mg/dL (ref 0.50–1.35)
Glucose, Bld: 88 mg/dL (ref 70–99)
Total Bilirubin: 0.8 mg/dL (ref 0.3–1.2)
Total Protein: 6.6 g/dL (ref 6.0–8.3)

## 2013-07-05 LAB — CBC
Hemoglobin: 15.3 g/dL (ref 13.0–17.0)
MCH: 29.7 pg (ref 26.0–34.0)
MCHC: 34.6 g/dL (ref 30.0–36.0)
RDW: 13.4 % (ref 11.5–15.5)

## 2013-07-05 LAB — LIPID PANEL
Cholesterol: 142 mg/dL (ref 0–200)
HDL: 33 mg/dL — ABNORMAL LOW (ref >39)
LDL Cholesterol: 84 mg/dL (ref 0–99)
Total CHOL/HDL Ratio: 4.3 Ratio
Triglycerides: 126 mg/dL (ref <150)
VLDL: 25 mg/dL (ref 0–40)

## 2013-07-05 MED ORDER — SIMVASTATIN 40 MG PO TABS: 40.0000 mg | ORAL_TABLET | Freq: Every day | ORAL | Status: DC

## 2013-07-05 NOTE — Progress Notes (Signed)
Subjective: Here as a new patient today.  Running out of his medications, needs refills.  Was seeing internal medicine prior, but wants to change providers.  Here for follow-up of hypertension. He is not exercising.  He is not adherent to a low-salt diet.  Blood pressure is well controlled at home.  Cardiac symptoms: none. Patient denies: chest pain, dyspnea, fatigue and lower extremity edema. Cardiovascular risk factors: advanced age (older than 83 for men, 46 for women), dyslipidemia, hypertension, male gender, obesity (BMI >= 30 kg/m2) and sedentary lifestyle. Use of agents associated with hypertension: none. History of target organ damage: none.   Here for follow up of elevated lipids. Compliance with treatment has been good. The patient exercises rarely.  Patient denies muscle pain associated with male medications.    He has hx/o anxiety.  Has been on Tranxene for 20+ years.  Originally put on this after hospital visit for chest pain that was ultimately found to be GERD and anxiety related.  Still gets occasional GERD, but uses OTC medication and this helps.   He takes usually 1 Tranxene daily, but sometimes on bad days, may take 2-3 a day.  Last refill was in august for #90 (verified).    Overall is doing fine. Works as Sales executive.  No particular c/o.   Needs to do fasting labs, get refill on cholesterol medication and establish care.   Past Medical History  Diagnosis Date  . Allergy   . GERD (gastroesophageal reflux disease)   . Hypertension   . Guillain-Barre syndrome 1995  . Hyperlipidemia   . Former smoker      Objective:   Physical Exam  BP 130/88  Pulse 76  Temp(Src) 98.1 F (36.7 C) (Oral)  Resp 16  Ht 5\' 10"  (1.778 m)  Wt 219 lb (99.338 kg)  BMI 31.42 kg/m2  General appearance: alert, no distress, WD/WN, obese white male, wearing glasses Oral cavity: MMM, no lesions Neck: supple, no lymphadenopathy, no thyromegaly, no masses Heart: RRR, normal S1, S2, no  murmurs Lungs: CTA bilaterally, no wheezes, rhonchi, or rales Abdomen: +bs, soft, non tender, non distended, no masses, no hepatomegaly, no splenomegaly Extremities: no edema, no cyanosis, no clubbing Pulses: 2+ symmetric, upper and lower extremities, normal cap refill Neurological: alert, oriented x 3, CN2-12 intact, strength normal upper extremities and lower extremities, sensation normal throughout, DTRs 2+ throughout, no cerebellar signs, gait normal Psychiatric: normal affect, behavior normal, pleasant    Assessment and Plan :    Encounter Diagnoses  Name Primary?  . Essential hypertension, benign Yes  . Hyperlipidemia   . Pure hypercholesterolemia   . Generalized anxiety disorder   . History of Guillain-Barre syndrome   . Obesity, unspecified    Advised increased exercise, healthy low fat heart healthy diet.  Advised weight loss through diet and exercise changes.  C/t current medications.  Refilled simvastatin.   Reviewed prior labs from a year ago, reviewed prior EMR chart records.  No obvious red flags and concerns regarding anxiety or medication use.   F/u pending labs.

## 2013-09-10 ENCOUNTER — Telehealth: Payer: Self-pay | Admitting: Medical

## 2013-09-10 NOTE — Telephone Encounter (Signed)
pls call out the Tranxene, refill x 2, and order in Epic

## 2013-09-11 ENCOUNTER — Other Ambulatory Visit: Payer: Self-pay | Admitting: Family Medicine

## 2013-09-11 DIAGNOSIS — F419 Anxiety disorder, unspecified: Secondary | ICD-10-CM

## 2013-09-11 MED ORDER — CLORAZEPATE DIPOTASSIUM 7.5 MG PO TABS: 7.5000 mg | ORAL_TABLET | Freq: Three times a day (TID) | ORAL | Status: DC

## 2013-09-11 NOTE — Telephone Encounter (Signed)
I called the patients medication into his pharmacy per Crosby Oyster PA-C. CLS

## 2013-11-25 ENCOUNTER — Telehealth: Payer: Self-pay | Admitting: Medical

## 2013-11-25 ENCOUNTER — Other Ambulatory Visit: Payer: Self-pay | Admitting: Medical

## 2013-11-25 DIAGNOSIS — E7800 Pure hypercholesterolemia, unspecified: Secondary | ICD-10-CM

## 2013-11-25 DIAGNOSIS — I10 Essential (primary) hypertension: Secondary | ICD-10-CM

## 2013-11-25 MED ORDER — ATENOLOL 50 MG PO TABS: 50.0000 mg | ORAL_TABLET | Freq: Two times a day (BID) | ORAL | Status: DC

## 2013-11-25 MED ORDER — SIMVASTATIN 40 MG PO TABS: 40.0000 mg | ORAL_TABLET | Freq: Every day | ORAL | Status: DC

## 2013-11-25 NOTE — Telephone Encounter (Signed)
I sent a 90 day supply to mail order, he is due back for appt so pls set that up, but call in 30 day on atenolol if needed to local pharmacy

## 2013-11-25 NOTE — Telephone Encounter (Signed)
Left message for pt to schedule appt.  Jesus Burke pt is out of his Atenolol so needs that today, he is ok on the other written Rx.

## 2013-11-25 NOTE — Telephone Encounter (Signed)
i last saw him as new patient 9/14, due back in 3-4 mo which would be now.   Please set up f/u appt.

## 2013-11-25 NOTE — Telephone Encounter (Signed)
done

## 2013-12-04 NOTE — Telephone Encounter (Signed)
Pt got his meds from [facility] as pt requested

## 2013-12-10 ENCOUNTER — Ambulatory Visit (INDEPENDENT_AMBULATORY_CARE_PROVIDER_SITE_OTHER): Payer: BC Managed Care – PPO | Admitting: Medical

## 2013-12-10 ENCOUNTER — Encounter: Payer: Self-pay | Admitting: Medical

## 2013-12-10 VITALS — BP 130/80 | HR 72 | Temp 98.1°F | Resp 16 | Wt 224.0 lb

## 2013-12-10 DIAGNOSIS — F419 Anxiety disorder, unspecified: Secondary | ICD-10-CM

## 2013-12-10 DIAGNOSIS — I10 Essential (primary) hypertension: Secondary | ICD-10-CM

## 2013-12-10 DIAGNOSIS — R5381 Other malaise: Secondary | ICD-10-CM

## 2013-12-10 DIAGNOSIS — K219 Gastro-esophageal reflux disease without esophagitis: Secondary | ICD-10-CM

## 2013-12-10 DIAGNOSIS — F411 Generalized anxiety disorder: Secondary | ICD-10-CM

## 2013-12-10 DIAGNOSIS — R0683 Snoring: Secondary | ICD-10-CM

## 2013-12-10 DIAGNOSIS — R0989 Other specified symptoms and signs involving the circulatory and respiratory systems: Secondary | ICD-10-CM

## 2013-12-10 DIAGNOSIS — R0609 Other forms of dyspnea: Secondary | ICD-10-CM

## 2013-12-10 DIAGNOSIS — E785 Hyperlipidemia, unspecified: Secondary | ICD-10-CM

## 2013-12-10 DIAGNOSIS — R5383 Other fatigue: Secondary | ICD-10-CM

## 2013-12-10 DIAGNOSIS — G4710 Hypersomnia, unspecified: Secondary | ICD-10-CM

## 2013-12-10 DIAGNOSIS — J329 Chronic sinusitis, unspecified: Secondary | ICD-10-CM

## 2013-12-10 DIAGNOSIS — R400 Somnolence: Secondary | ICD-10-CM

## 2013-12-10 DIAGNOSIS — G478 Other sleep disorders: Secondary | ICD-10-CM

## 2013-12-10 MED ORDER — AMOXICILLIN 500 MG PO TABS: ORAL_TABLET | ORAL | Status: DC

## 2013-12-10 NOTE — Assessment & Plan Note (Signed)
Things overall going ok.  Was unemployed 2.5 years.  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  Works at [employer] in [place], enjoys this.  The patients are much nicer there.  Uses Tranxene usually just once daily at bedtime, but occasionally has to take a 2nd tablet.  Gets 90 day supply though to limit how often he has to get refills.  Tries to live as stress free as possible.

## 2013-12-10 NOTE — Assessment & Plan Note (Signed)
Compliant with medication.  No c/o.  Not checking BP

## 2013-12-10 NOTE — Assessment & Plan Note (Signed)
Compliant with medication without c/o. 

## 2013-12-10 NOTE — Progress Notes (Signed)
Subjective: Here for routine f/u on multiple issues.  Anxiety Things overall going ok.  Was unemployed 2.5 years.  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  Works at [employer] in [place], enjoys this.  The patients are much nicer there.  Uses Tranxene usually just once daily at bedtime, but occasionally has to take a 2nd tablet.  Gets 90 day supply though to limit how often he has to get refills.  Tries to live as stress free as possible.   GERD Controls mainly with diet.  Not on medication  HYPERTENSION Compliant with medication.  No c/o.  Not checking BP  Hyperlipidemia Compliant with medication without c/o.   Somewhat concerned about sleep apnea for years. He has yet to do a sleep study. He does report non restorative sleep, feels fatigued, daytime sleepiness, snoring noted, but no witnessed apnea spells.   He has 2+ weeks of sinus pressure, teeth discomfort on the left, congestion and ahead and nose, some color to blood-tinged mucus blowing out of the nose.  Using nothing for the symptoms.    ROS as in subjective  Objective:   Physical Exam  BP 130/80  Pulse 72  Temp(Src) 98.1 F (36.7 C) (Oral)  Resp 16  Wt 224 lb (101.606 kg)  General appearance: alert, no distress, WD/WN, obese white male, wearing glasses Oral cavity: MMM, no lesions Head tender over left maxillary sinus, conjunctiva normal, TMs pearly, pharynx mild erythema Neck: supple, no lymphadenopathy, no thyromegaly, no masses Heart: RRR, normal S1, S2, no murmurs Lungs: CTA bilaterally, no wheezes, rhonchi, or rales Extremities: no edema, no cyanosis, no clubbing Pulses: 2+ symmetric, upper and lower extremities, normal cap refill Neurological: alert, oriented x 3, CN2-12 intact, strength normal upper extremities and lower extremities, sensation normal throughout, DTRs 2+ throughout, no cerebellar signs, gait normal Psychiatric: normal affect, behavior normal, pleasant    Assessment and Plan :     1. Anxiety C/t Tranxene on average QHS daily prn, c/t efforts to maintain low stress life, advised he exercise and work on healthier life change.  2. GERD Diet controlled  3. HYPERTENSION c/t. same medication regularly  4. Hyperlipidemia Lab today, c/t same medication - ALT  5. Snoring Declines sleep study at this time, but will reconsider in a month or so.  Discussed diagnosis of sleep apnea, complications, testing, treatments.   6. Daytime somnolence Same plan as snoring.  7. Other malaise and fatigue Same plan as snoring.  8. Non-restorative sleep Same plan as snoring  9. Sinusitis Begin Amoxicillin, hydrate well, rest, recheck if not improving in 4-5 days.

## 2013-12-10 NOTE — Assessment & Plan Note (Signed)
Controls mainly with diet.  Not on medication

## 2013-12-11 LAB — ALT: ALT: 25 U/L (ref 0–53)

## 2014-01-16 ENCOUNTER — Telehealth: Payer: Self-pay | Admitting: Internal Medicine

## 2014-01-16 NOTE — Telephone Encounter (Signed)
Faxed over form for medical records for colonoscopy to Dr. Gillie Manners

## 2014-01-22 ENCOUNTER — Telehealth: Payer: Self-pay | Admitting: Medical

## 2014-01-22 NOTE — Telephone Encounter (Signed)
fyi

## 2014-02-25 ENCOUNTER — Other Ambulatory Visit: Payer: Self-pay | Admitting: Family Medicine

## 2014-02-25 ENCOUNTER — Telehealth: Payer: Self-pay | Admitting: Medical

## 2014-02-25 DIAGNOSIS — F419 Anxiety disorder, unspecified: Secondary | ICD-10-CM

## 2014-02-25 MED ORDER — CLORAZEPATE DIPOTASSIUM 7.5 MG PO TABS: 7.5000 mg | ORAL_TABLET | Freq: Three times a day (TID) | ORAL | Status: DC

## 2014-02-25 NOTE — Telephone Encounter (Signed)
MEDICATION WAS CALLED OUT PER SHANE TYSINGER PAC AND ORDERS WAS PLACED IN EPIC. CLS

## 2014-02-25 NOTE — Telephone Encounter (Signed)
pls call out and update in epic.

## 2014-03-04 ENCOUNTER — Encounter: Payer: Self-pay | Admitting: Medical

## 2014-03-04 ENCOUNTER — Ambulatory Visit (INDEPENDENT_AMBULATORY_CARE_PROVIDER_SITE_OTHER): Payer: Managed Care, Other (non HMO) | Admitting: Medical

## 2014-03-04 VITALS — BP 120/80 | HR 60 | Temp 98.0°F | Resp 14 | Wt 221.0 lb

## 2014-03-04 DIAGNOSIS — R0609 Other forms of dyspnea: Secondary | ICD-10-CM

## 2014-03-04 DIAGNOSIS — G4710 Hypersomnia, unspecified: Secondary | ICD-10-CM

## 2014-03-04 DIAGNOSIS — F419 Anxiety disorder, unspecified: Secondary | ICD-10-CM

## 2014-03-04 DIAGNOSIS — R0683 Snoring: Secondary | ICD-10-CM

## 2014-03-04 DIAGNOSIS — I10 Essential (primary) hypertension: Secondary | ICD-10-CM

## 2014-03-04 DIAGNOSIS — E7800 Pure hypercholesterolemia, unspecified: Secondary | ICD-10-CM

## 2014-03-04 DIAGNOSIS — E669 Obesity, unspecified: Secondary | ICD-10-CM

## 2014-03-04 DIAGNOSIS — F411 Generalized anxiety disorder: Secondary | ICD-10-CM

## 2014-03-04 DIAGNOSIS — R0989 Other specified symptoms and signs involving the circulatory and respiratory systems: Secondary | ICD-10-CM

## 2014-03-04 DIAGNOSIS — R400 Somnolence: Secondary | ICD-10-CM

## 2014-03-04 DIAGNOSIS — E785 Hyperlipidemia, unspecified: Secondary | ICD-10-CM

## 2014-03-04 MED ORDER — ATENOLOL 50 MG PO TABS: 50.0000 mg | ORAL_TABLET | Freq: Two times a day (BID) | ORAL | Status: DC

## 2014-03-04 MED ORDER — CLORAZEPATE DIPOTASSIUM 7.5 MG PO TABS: 7.5000 mg | ORAL_TABLET | Freq: Two times a day (BID) | ORAL | Status: DC | PRN

## 2014-03-04 MED ORDER — SIMVASTATIN 40 MG PO TABS: 40.0000 mg | ORAL_TABLET | Freq: Every day | ORAL | Status: DC

## 2014-03-04 NOTE — Progress Notes (Signed)
   Subjective:   Jesus Burke is a 63 y.o. male presenting on 03/04/2014 with Follow-up and Medication Refill  Anxiety - does fine on current medication, uses Tranxene daily to BID prn.   HTN  - compliant with Atenolol - takes daily, but for conveniences gets BID so he doesn't have to go to pharmacy so frequently.  Hyperlipidemia - complaint with medication without c/o  No other complaint.  Review of Systems ROS as in subjective      Objective:     Filed Vitals:   03/04/14 0900  BP: 120/80  Pulse: 60  Temp: 98 F (36.7 C)  Resp: 14    General appearance: alert, no distress, WD/WN Neck: supple, no lymphadenopathy, no thyromegaly, no masses Heart: RRR, normal S1, S2, no murmurs Lungs: CTA bilaterally, no wheezes, rhonchi, or rales Abdomen: +bs, soft, non tender, non distended, no masses, no hepatomegaly, no splenomegaly Pulses: 2+ symmetric, upper and lower extremities, normal cap refill Ext: no edema      Assessment: Encounter Diagnoses  Name Primary?  Marland Kitchen Anxiety state, unspecified Yes  . Essential hypertension, benign   . Hyperlipidemia   . Obesity   . Snoring   . Daytime somnolence   . Pure hypercholesterolemia   . HYPERTENSION   . Anxiety      Plan: Anxiety - does fine on Tranxene qd - BID, been on this long term HTN - c/t same medication Hyperlipemia - c/t same medication Obesity - c/t efforts at weight loss Snoring, daytime somnolence - work on weight loss, is considering dental mouthpiece, plan for sleep study late summer   Jesus Burke was seen today for follow-up and medication refill.  Diagnoses and associated orders for this visit:  Anxiety state, unspecified  Essential hypertension, benign  Hyperlipidemia  Obesity  Snoring  Daytime somnolence  Pure hypercholesterolemia - simvastatin (ZOCOR) 40 MG tablet; Take 1 tablet (40 mg total) by mouth at bedtime.  HYPERTENSION - atenolol (TENORMIN) 50 MG tablet; Take 1 tablet (50 mg  total) by mouth 2 (two) times daily.  Anxiety - clorazepate (TRANXENE) 7.5 MG tablet; Take 1 tablet (7.5 mg total) by mouth 2 (two) times daily as needed for anxiety.    Return 07/2014.

## 2014-08-26 ENCOUNTER — Other Ambulatory Visit: Payer: Self-pay | Admitting: Family Medicine

## 2014-08-26 ENCOUNTER — Encounter: Payer: Managed Care, Other (non HMO) | Admitting: Medical

## 2014-08-26 ENCOUNTER — Other Ambulatory Visit: Payer: Self-pay | Admitting: Medical

## 2014-08-26 ENCOUNTER — Telehealth: Payer: Self-pay | Admitting: Family Medicine

## 2014-08-26 DIAGNOSIS — F419 Anxiety disorder, unspecified: Secondary | ICD-10-CM

## 2014-08-26 MED ORDER — CLORAZEPATE DIPOTASSIUM 7.5 MG PO TABS: 7.5000 mg | ORAL_TABLET | Freq: Two times a day (BID) | ORAL | Status: DC | PRN

## 2014-08-26 NOTE — Telephone Encounter (Signed)
Pt had to cancel med check due to co worker death in family.   But will reschedule.  Pt needs refill of Cloraz Dipot 7.5 mg to [facility]

## 2014-08-26 NOTE — Telephone Encounter (Signed)
Call it in, I reordered in [place]

## 2014-08-26 NOTE — Telephone Encounter (Signed)
Medication was called out and ordered in the system per Chana [facility]

## 2014-09-22 ENCOUNTER — Ambulatory Visit (INDEPENDENT_AMBULATORY_CARE_PROVIDER_SITE_OTHER): Payer: Managed Care, Other (non HMO) | Admitting: Medical

## 2014-09-22 ENCOUNTER — Encounter: Payer: Self-pay | Admitting: Medical

## 2014-09-22 VITALS — BP 120/82 | HR 70 | Temp 98.1°F | Wt 216.0 lb

## 2014-09-22 DIAGNOSIS — R030 Elevated blood-pressure reading, without diagnosis of hypertension: Secondary | ICD-10-CM | POA: Insufficient documentation

## 2014-09-22 DIAGNOSIS — E782 Mixed hyperlipidemia: Secondary | ICD-10-CM

## 2014-09-22 DIAGNOSIS — F419 Anxiety disorder, unspecified: Secondary | ICD-10-CM

## 2014-09-22 DIAGNOSIS — E669 Obesity, unspecified: Secondary | ICD-10-CM

## 2014-09-22 DIAGNOSIS — Z1211 Encounter for screening for malignant neoplasm of colon: Secondary | ICD-10-CM

## 2014-09-22 DIAGNOSIS — Z2821 Immunization not carried out because of patient refusal: Secondary | ICD-10-CM

## 2014-09-22 DIAGNOSIS — F411 Generalized anxiety disorder: Secondary | ICD-10-CM

## 2014-09-22 MED ORDER — CLORAZEPATE DIPOTASSIUM 7.5 MG PO TABS: 7.5000 mg | ORAL_TABLET | Freq: Two times a day (BID) | ORAL | Status: DC | PRN

## 2014-09-22 MED ORDER — ATENOLOL 50 MG PO TABS: 50.0000 mg | ORAL_TABLET | Freq: Two times a day (BID) | ORAL | Status: DC

## 2014-09-22 NOTE — Progress Notes (Signed)
Subjective:   Jesus Burke is a 63 y.o. male presenting on 09/22/2014 with FASTING MEDICATION CHECK  Here for routine med check.  Anxiety - here for recheck on Tranxene, uses this and Atenolol for nerves, anxiety.  No particular c/o.  Dyslipidemia - had bad red skin and hot flash with first use of Niacin in remote past, didn't do well on Lipitor.   Has always done fine on Simvastatin.   Trying to eat low fat diet.   Got a mailing from GI about repeat colonoscopy.  He wants to clarify, no prior official diagnosis of hypertension, but has had borderline BP elevation in the past, currently on Atenolol for anxiety, not specifically BP.  No other complaint.  Review of Systems ROS as in subjective  Past Medical History  Diagnosis Date  . Allergy   . GERD (gastroesophageal reflux disease)   . Guillain-Barre syndrome 1995  . Former smoker   . Anxiety     been on Tranxene long term, qd to BID  . Paresthesia of foot     left great toe since [employer]  . Elevated blood pressure reading without diagnosis of hypertension     borderline, on medication for anxiety (Atenolol)  . Mixed dyslipidemia   . Obesity          Objective:  Filed Vitals:   09/22/14 1050  BP: 120/82  Pulse: 70  Temp: 98.1 F (36.7 C)    General appearance: alert, no distress, WD/WN Neck: supple, no lymphadenopathy, no thyromegaly, no masses, no bruits Heart: RRR, normal S1, S2, no murmurs Lungs: CTA bilaterally, no wheezes, rhonchi, or rales Abdomen: +bs, soft, non tender, non distended, no masses, no hepatomegaly, no splenomegaly Pulses: 2+ symmetric, upper and lower extremities, normal cap refill Ext: no edema Psych: pleasant, good eye contact      Assessment: Encounter Diagnoses  Name Primary?  . Generalized anxiety disorder Yes  . Mixed dyslipidemia   . Obesity   . Elevated blood-pressure reading without diagnosis of hypertension   . Anxiety   . Influenza vaccination declined  by patient   . Special screening for malignant neoplasms, colon      Plan: Anxiety-refilled Tranxene and beta blocker which he does fine on this.   Stable, no particular issues.  Mixed dyslipidemia-labs today, we'll likely continue statin, has not done well on niacin and Lipitor prior  Obesity - encouraged him to work on weight loss efforts through diet and exercise, he has lost 5 lb since last visit  Elevated BP - on beta blocker for anxiety, borderline elevate BP prior  He will let us know when he is ready to be referred for colonoscopy, likely after the first of the year 10/2014  Declines flu vaccine, hx/o Guillain Rivaan Burke was seen today for fasting medication check.  Diagnoses and associated orders for this visit:  Generalized anxiety disorder - Comprehensive metabolic panel - CBC - Lipid panel - Vitamin D, 25-hydroxy - atenolol (TENORMIN) 50 MG tablet; Take 1 tablet (50 mg total) by mouth 2 (two) times daily.  Mixed dyslipidemia - Comprehensive metabolic panel - CBC - Lipid panel - Vitamin D, 25-hydroxy  Obesity - Comprehensive metabolic panel - CBC - Lipid panel - Vitamin D, 25-hydroxy  Elevated blood-pressure reading without diagnosis of hypertension  Anxiety - clorazepate (TRANXENE) 7.5 MG tablet; Take 1 tablet (7.5 mg total) by mouth 2 (two) times daily as needed for anxiety.  Influenza vaccination declined by patient Comments: hx/o [name]  Barre  Special screening for malignant neoplasms, colon    Return pending labs.

## 2014-09-23 ENCOUNTER — Other Ambulatory Visit: Payer: Self-pay | Admitting: Family Medicine

## 2014-09-23 ENCOUNTER — Other Ambulatory Visit: Payer: Self-pay | Admitting: Medical

## 2014-09-23 DIAGNOSIS — E7800 Pure hypercholesterolemia, unspecified: Secondary | ICD-10-CM

## 2014-09-23 LAB — CBC
HEMATOCRIT: 43.5 % (ref 39.0–52.0)
HEMOGLOBIN: 15.4 g/dL (ref 13.0–17.0)
MCH: 30.4 pg (ref 26.0–34.0)
MCHC: 35.4 g/dL (ref 30.0–36.0)
MCV: 86 fL (ref 78.0–100.0)
MPV: 9 fL — ABNORMAL LOW (ref 9.4–12.4)
Platelets: 224 10*3/uL (ref 150–400)
RBC: 5.06 MIL/uL (ref 4.22–5.81)
RDW: 13.3 % (ref 11.5–15.5)
WBC: 8 10*3/uL (ref 4.0–10.5)

## 2014-09-23 LAB — COMPREHENSIVE METABOLIC PANEL
ALK PHOS: 48 U/L (ref 39–117)
ALT: 25 U/L (ref 0–53)
AST: 20 U/L (ref 0–37)
Albumin: 4.3 g/dL (ref 3.5–5.2)
BUN: 12 mg/dL (ref 6–23)
CO2: 27 mEq/L (ref 19–32)
CREATININE: 0.75 mg/dL (ref 0.50–1.35)
Calcium: 9.2 mg/dL (ref 8.4–10.5)
Chloride: 103 mEq/L (ref 96–112)
GLUCOSE: 89 mg/dL (ref 70–99)
POTASSIUM: 4.3 meq/L (ref 3.5–5.3)
Sodium: 137 mEq/L (ref 135–145)
Total Bilirubin: 0.8 mg/dL (ref 0.2–1.2)
Total Protein: 6.7 g/dL (ref 6.0–8.3)

## 2014-09-23 LAB — LIPID PANEL
Cholesterol: 131 mg/dL (ref 0–200)
HDL: 29 mg/dL — ABNORMAL LOW (ref >39)
LDL Cholesterol: 83 mg/dL (ref 0–99)
Total CHOL/HDL Ratio: 4.5 Ratio
Triglycerides: 94 mg/dL (ref <150)
VLDL: 19 mg/dL (ref 0–40)

## 2014-09-23 LAB — VITAMIN D 25 HYDROXY (VIT D DEFICIENCY, FRACTURES): Vit D, 25-Hydroxy: 23 ng/mL — ABNORMAL LOW (ref 30–100)

## 2014-09-23 MED ORDER — FENOFIBRATE 145 MG PO TABS: 145.0000 mg | ORAL_TABLET | Freq: Every day | ORAL | Status: DC

## 2014-09-23 MED ORDER — SIMVASTATIN 40 MG PO TABS: 40.0000 mg | ORAL_TABLET | Freq: Every day | ORAL | Status: DC

## 2014-09-23 MED ORDER — VITAMIN D (ERGOCALCIFEROL) 1.25 MG (50000 UNIT) PO CAPS: 50000.0000 [IU] | ORAL_CAPSULE | ORAL | Status: DC

## 2015-04-29 ENCOUNTER — Encounter: Payer: Self-pay | Admitting: Medical

## 2015-04-29 ENCOUNTER — Ambulatory Visit (INDEPENDENT_AMBULATORY_CARE_PROVIDER_SITE_OTHER): Payer: BLUE CROSS/BLUE SHIELD | Admitting: Medical

## 2015-04-29 VITALS — BP 116/78 | HR 70 | Resp 14 | Ht 70.0 in | Wt 222.4 lb

## 2015-04-29 DIAGNOSIS — F411 Generalized anxiety disorder: Secondary | ICD-10-CM

## 2015-04-29 DIAGNOSIS — R252 Cramp and spasm: Secondary | ICD-10-CM | POA: Diagnosis not present

## 2015-04-29 DIAGNOSIS — E669 Obesity, unspecified: Secondary | ICD-10-CM

## 2015-04-29 DIAGNOSIS — E559 Vitamin D deficiency, unspecified: Secondary | ICD-10-CM | POA: Diagnosis not present

## 2015-04-29 DIAGNOSIS — F419 Anxiety disorder, unspecified: Secondary | ICD-10-CM | POA: Diagnosis not present

## 2015-04-29 DIAGNOSIS — R030 Elevated blood-pressure reading, without diagnosis of hypertension: Secondary | ICD-10-CM | POA: Diagnosis not present

## 2015-04-29 DIAGNOSIS — E782 Mixed hyperlipidemia: Secondary | ICD-10-CM | POA: Diagnosis not present

## 2015-04-29 DIAGNOSIS — I10 Essential (primary) hypertension: Secondary | ICD-10-CM | POA: Insufficient documentation

## 2015-04-29 LAB — HEMOGLOBIN A1C
HEMOGLOBIN A1C: 5.5 % (ref <5.7)
MEAN PLASMA GLUCOSE: 111 mg/dL (ref <117)

## 2015-04-29 LAB — COMPREHENSIVE METABOLIC PANEL
ALT: 38 U/L (ref 0–53)
AST: 27 U/L (ref 0–37)
Albumin: 4 g/dL (ref 3.5–5.2)
Alkaline Phosphatase: 31 U/L — ABNORMAL LOW (ref 39–117)
BUN: 14 mg/dL (ref 6–23)
CO2: 27 mEq/L (ref 19–32)
CREATININE: 1.01 mg/dL (ref 0.50–1.35)
Calcium: 9.3 mg/dL (ref 8.4–10.5)
Chloride: 105 mEq/L (ref 96–112)
GLUCOSE: 97 mg/dL (ref 70–99)
Potassium: 4.4 mEq/L (ref 3.5–5.3)
Sodium: 140 mEq/L (ref 135–145)
TOTAL PROTEIN: 6.4 g/dL (ref 6.0–8.3)
Total Bilirubin: 0.6 mg/dL (ref 0.2–1.2)

## 2015-04-29 LAB — LIPID PANEL
CHOL/HDL RATIO: 4.5 ratio
Cholesterol: 134 mg/dL (ref 0–200)
HDL: 30 mg/dL — ABNORMAL LOW (ref >=40)
LDL CALC: 90 mg/dL (ref 0–99)
TRIGLYCERIDES: 72 mg/dL (ref <150)
VLDL: 14 mg/dL (ref 0–40)

## 2015-04-29 MED ORDER — ATENOLOL 50 MG PO TABS: 50.0000 mg | ORAL_TABLET | Freq: Two times a day (BID) | ORAL | Status: DC

## 2015-04-29 MED ORDER — CLORAZEPATE DIPOTASSIUM 7.5 MG PO TABS: 7.5000 mg | ORAL_TABLET | Freq: Two times a day (BID) | ORAL | Status: DC | PRN

## 2015-04-29 NOTE — Progress Notes (Signed)
Subjective:   Jesus Burke is a 64 y.o. male presenting on 04/29/2015 with Medication Management  Here for routine med check.   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   Enjoys this except for redundant paperwork.    Anxiety - here for recheck on Tranxene, uses this and Atenolol for nerves, anxiety.  No particular c/o.  However, he has heard that price of Tranxene will be going up in the near future.  Dyslipidemia - had bad red skin and hot flash with first use of Niacin in remote past, didn't do well on Lipitor.   Has always done fine on Simvastatin.  compliant with Simvastatin and fenofibrate.  Fenofibrate is expensive.  Hx/o elevated BP without official diagnosis of hypertension - compliant with atenolol.  He wants to clarify, no prior official diagnosis of hypertension, but has had borderline BP elevation in the past, currently on Atenolol for anxiety, not specifically BP.  Having some legs cramps at times.  drinks some water, not great about eating fruits.  No other complaint.  Review of Systems ROS as in subjective  Past Medical History  Diagnosis Date  . Allergy   . GERD (gastroesophageal reflux disease)   . Guillain-Barre syndrome 1995  . Former smoker   . Anxiety     been on Tranxene long term, qd to BID  . Paresthesia of foot     left great toe since [employer]  . Elevated blood pressure reading without diagnosis of hypertension     borderline, on medication for anxiety (Atenolol)  . Mixed dyslipidemia   . Obesity        Objective:  BP 116/78 mmHg  Pulse 70  Resp 14  Ht 5\' 10"  (1.778 m)  Wt 222 lb 6.4 oz (100.88 kg)  BMI 31.91 kg/m2  SpO2 97%  BP Readings from Last 3 Encounters:  04/29/15 116/78  09/22/14 120/82  03/04/14 120/80   Wt Readings from Last 3 Encounters:  04/29/15 222 lb 6.4 oz (100.88 kg)  09/22/14 216 lb (97.977 kg)  03/04/14 221 lb (100.245 kg)    General appearance: alert, no distress,  WD/WN Neck: supple, no lymphadenopathy, no thyromegaly, no masses, no bruits Heart: RRR, normal S1, S2, no murmurs Lungs: CTA bilaterally, no wheezes, rhonchi, or rales Abdomen: +bs, soft, non tender, non distended, no masses, no hepatomegaly, no splenomegaly Pulses: 2+ symmetric, upper and lower extremities, normal cap refill Ext: no edema Psych: pleasant, good eye contact      Assessment: Encounter Diagnoses  Name Primary?  . Mixed dyslipidemia   . Generalized anxiety disorder   . Vitamin D deficiency   . Elevated blood pressure reading without diagnosis of hypertension Yes  . Obesity   . Anxiety   . Cramp of both lower extremities      Plan: Anxiety-refilled Tranxene and beta blocker which he does fine on this.   Stable, no particular issues.  Mixed dyslipidemia-labs today, continue statin, for now c/t fenofibrate  Elevated BP - on beta blocker for anxiety, borderline elevate BP prior  Vitamin D deficiency - has been using prescription weekly ergocalciferol, labs today  Leg cramps - discussed diet, water intake, fruit intake  Jesus Burke was seen today for medication management.  Diagnoses and all orders for this visit:  Elevated blood pressure reading without diagnosis of hypertension Orders: -     Hemoglobin A1c  Mixed dyslipidemia Orders: -     Lipid panel -  Comprehensive metabolic panel -     Hemoglobin A1c  Generalized anxiety disorder Orders: -     atenolol (TENORMIN) 50 MG tablet; Take 1 tablet (50 mg total) by mouth 2 (two) times daily.  Vitamin D deficiency Orders: -     Vitamin D, 25-hydroxy  Obesity Orders: -     Hemoglobin A1c  Anxiety Orders: -     clorazepate (TRANXENE) 7.5 MG tablet; Take 1 tablet (7.5 mg total) by mouth 2 (two) times daily as needed for anxiety.  Cramp of both lower extremities   Return pending labs.

## 2015-04-30 ENCOUNTER — Other Ambulatory Visit: Payer: Self-pay | Admitting: Medical

## 2015-04-30 DIAGNOSIS — E7800 Pure hypercholesterolemia, unspecified: Secondary | ICD-10-CM

## 2015-04-30 LAB — VITAMIN D 25 HYDROXY (VIT D DEFICIENCY, FRACTURES): VIT D 25 HYDROXY: 29 ng/mL — AB (ref 30–100)

## 2015-04-30 MED ORDER — SIMVASTATIN 40 MG PO TABS: 40.0000 mg | ORAL_TABLET | Freq: Every day | ORAL | Status: DC

## 2015-04-30 MED ORDER — OMEGA-3-ACID ETHYL ESTERS 1 G PO CAPS: 2.0000 g | ORAL_CAPSULE | Freq: Two times a day (BID) | ORAL | Status: DC

## 2015-05-01 ENCOUNTER — Telehealth: Payer: Self-pay | Admitting: Family Medicine

## 2015-05-01 NOTE — Telephone Encounter (Signed)
Pt states Omega 3 $154.  I called rep no longer have Lov[employer] coupons.  Per pharmacy [phone] is cash price, pt doesn't have ins on file.  Called pt & he is to call pharmacy with prescription coverage info.

## 2015-05-07 ENCOUNTER — Telehealth: Payer: Self-pay | Admitting: Medical

## 2015-05-07 NOTE — Telephone Encounter (Signed)
Called for P.A. Omega 3 t# [phone], this is non covered drug, only alternative is OTC. I filed Benefit appeal form at [email or web address]

## 2015-06-03 NOTE — Telephone Encounter (Signed)
Faxed 2nd request for appeal

## 2015-07-13 NOTE — Telephone Encounter (Signed)
Called [employer] regarding denial, they state can appeal the denial online. Form was completed again and should receive response within 5 days. Left message for pt

## 2015-07-13 NOTE — Telephone Encounter (Signed)
Pt informed, he states he never started yet, was too expensive  Would be willing to try OTC if is denied again

## 2015-07-27 NOTE — Telephone Encounter (Signed)
Recv'd response from [employer] that needed additional info.  Typed and faxed letter and copy of labs

## 2015-11-01 HISTORY — PX: PROSTATECTOMY: SHX69

## 2015-11-16 ENCOUNTER — Encounter: Payer: Self-pay | Admitting: Family Medicine

## 2015-11-16 ENCOUNTER — Ambulatory Visit (INDEPENDENT_AMBULATORY_CARE_PROVIDER_SITE_OTHER): Payer: BLUE CROSS/BLUE SHIELD | Admitting: Family Medicine

## 2015-11-16 VITALS — BP 130/86 | HR 72 | Temp 98.3°F | Ht 70.0 in | Wt 231.2 lb

## 2015-11-16 DIAGNOSIS — L989 Disorder of the skin and subcutaneous tissue, unspecified: Secondary | ICD-10-CM

## 2015-11-16 NOTE — Progress Notes (Signed)
   Subjective:    Patient ID: Jesus Burke, male    DOB: 09-Mar-1951, 65 y.o.   MRN: [id number]  HPI He is here for evaluation of a lesion present on his right upper arm.He also is having symptoms of tingling that sometimes is in the right occipital area that he relates to working on the computer. He also notes he will have a tingling sensation going down into his hand into the area of the thumb and index finger over when he moves his hand to more neutral position this goes away.   Review of Systems     Objective:   Physical Exam He has a round smooth confluently pigmented 1-1/2 cm lesion present on the right upper arm over the deltoid. Normal strength of the hands negative Tinel's and Phalen's test.       Assessment & Plan:  Benign skin lesion Reassured him that the skin lesion is not a problem and was not causing his symptoms. Discussed the other symptoms that he is having and some sound like that's occipital neuralgia/tension headache, the hand symptoms sound like this could possibly be carpal tunnel. I explained this to him and recommended keeping his hand in a neutral position to see if this will help. He will keep you informed as to his progress.

## 2015-12-21 ENCOUNTER — Telehealth: Payer: Self-pay | Admitting: Medical

## 2015-12-21 NOTE — Telephone Encounter (Signed)
Pt requested records faxed to [facility] physicians. Records faxed to [phone]

## 2015-12-31 ENCOUNTER — Encounter: Payer: Self-pay | Admitting: Medical

## 2015-12-31 ENCOUNTER — Ambulatory Visit (INDEPENDENT_AMBULATORY_CARE_PROVIDER_SITE_OTHER): Payer: BLUE CROSS/BLUE SHIELD | Admitting: Medical

## 2015-12-31 VITALS — BP 140/100 | HR 67 | Wt 223.0 lb

## 2015-12-31 DIAGNOSIS — R29898 Other symptoms and signs involving the musculoskeletal system: Secondary | ICD-10-CM | POA: Diagnosis not present

## 2015-12-31 DIAGNOSIS — M542 Cervicalgia: Secondary | ICD-10-CM

## 2015-12-31 DIAGNOSIS — F411 Generalized anxiety disorder: Secondary | ICD-10-CM | POA: Diagnosis not present

## 2015-12-31 DIAGNOSIS — E7800 Pure hypercholesterolemia, unspecified: Secondary | ICD-10-CM

## 2015-12-31 DIAGNOSIS — E559 Vitamin D deficiency, unspecified: Secondary | ICD-10-CM

## 2015-12-31 DIAGNOSIS — E669 Obesity, unspecified: Secondary | ICD-10-CM

## 2015-12-31 DIAGNOSIS — R202 Paresthesia of skin: Secondary | ICD-10-CM | POA: Diagnosis not present

## 2015-12-31 DIAGNOSIS — I10 Essential (primary) hypertension: Secondary | ICD-10-CM

## 2015-12-31 DIAGNOSIS — E782 Mixed hyperlipidemia: Secondary | ICD-10-CM | POA: Diagnosis not present

## 2015-12-31 DIAGNOSIS — F419 Anxiety disorder, unspecified: Secondary | ICD-10-CM | POA: Diagnosis not present

## 2015-12-31 MED ORDER — ATENOLOL 50 MG PO TABS: 50.0000 mg | ORAL_TABLET | Freq: Two times a day (BID) | ORAL | Status: DC

## 2015-12-31 MED ORDER — SIMVASTATIN 40 MG PO TABS: 40.0000 mg | ORAL_TABLET | Freq: Every day | ORAL | Status: DC

## 2015-12-31 MED ORDER — CLORAZEPATE DIPOTASSIUM 7.5 MG PO TABS: 7.5000 mg | ORAL_TABLET | Freq: Two times a day (BID) | ORAL | Status: DC | PRN

## 2015-12-31 NOTE — Progress Notes (Signed)
Subjective:   Jesus Burke is a 65 y.o. male presenting on 12/31/2015 with med check  Here for routine med check.     Anxiety - here for recheck on Tranxene, uses this and Atenolol for nerves, anxiety.  No particular c/o.    Dyslipidemia - had bad red skin and hot flash with first use of Niacin in remote past, didn't do well on Lipitor.   Has always done fine on Simvastatin.  compliant with Simvastatin and fenofibrate.  Fenofibrate and Lovaza is expensive.  Compliant with atenolol primarily for anxiety, but in past year we have settled on diagnosis of hypertension.  Not checking BP.  Taking Atenolol 50mg  once daily, not twice.  Saw Dr. Redmond Burke here in January.  Still having some issues about carpal tunnel syndrome right arm.  Using OTC soft wrist splint.  Will being seeing neurology soon.   He does note [place] injury in teenage years, had fracture of both wrists.   Has always had some problems with the right wrists.    Also has some difficulty with full ROM of neck to the right.  Seeing Dr. Nadara Burke in [facility].  Still not sleeping well, has plans to do sleep study, needs colonoscopy done this year.     No other complaint.  Review of Systems ROS as in subjective  Past Medical History  Diagnosis Date  . Allergy   . GERD (gastroesophageal reflux disease)   . Guillain-Barre syndrome ([place]) 1995  . Former smoker   . Anxiety     been on Tranxene long term, qd to BID  . Paresthesia of foot     left great toe since [employer]  . Mixed dyslipidemia   . Obesity   . Hypertension     Past Surgical History  Procedure Laterality Date  . Tonsillectomy    . Colonoscopy  2006      Objective:  BP 140/100 mmHg  Pulse 67  Wt 223 lb (101.152 kg)  BP Readings from Last 3 Encounters:  12/31/15 140/100  11/16/15 130/86  04/29/15 116/78   Wt Readings from Last 3 Encounters:  12/31/15 223 lb (101.152 kg)  11/16/15 231 lb 3.2 oz (104.872 kg)  04/29/15 222 lb 6.4 oz (100.88 kg)      General appearance: alert, no distress, WD/WN Neck: supple, no lymphadenopathy, no thyromegaly, no masses, no bruits, nontender, reduced rotation to the right.    MSK: arms nontender, normal ROM Neuro: +tinels, but no weakness, normal strength and sensation Heart: RRR, normal S1, S2, no murmurs Lungs: CTA bilaterally, no wheezes, rhonchi, or rales Pulses: 2+ symmetric, upper and lower extremities, normal cap refill Ext: no edema Psych: pleasant, good eye contact      Assessment: Encounter Diagnoses  Name Primary?  . Generalized anxiety disorder Yes  . Essential hypertension   . Mixed dyslipidemia   . Vitamin D deficiency   . Obesity   . Anxiety   . Pure hypercholesterolemia   . Decreased ROM of neck   . Neck pain   . Paresthesia of right upper extremity      Plan: Anxiety-refilled Tranxene and beta blocker which he does fine on this.   Stable, no particular issues.  Mixed dyslipidemia-c/t Simvastatin, consider 250mg  OTC Niacin which he thinks he tolerated prior.  Lovaza from last visit was too expensive  Hypertension - on beta blocker for anxiety and BP.  Advised he monitor home BPs.   He is only taking Atenolol once daily currently  Vitamin D deficiency - advised daily 1000 IU Vit D.  Obesity - c/t efforts at weight loss  Neck pain, decrease ROM - offered xray.  He already has appt planned with neurology for this and CTS symptoms and plans to f/u there.   Jesus Burke was seen today for med check.  Diagnoses and all orders for this visit:  Generalized anxiety disorder -     atenolol (TENORMIN) 50 MG tablet; Take 1 tablet (50 mg total) by mouth 2 (two) times daily.  Essential hypertension  Mixed dyslipidemia  Vitamin D deficiency  Obesity  Anxiety -     clorazepate (TRANXENE) 7.5 MG tablet; Take 1 tablet (7.5 mg total) by mouth 2 (two) times daily as needed for anxiety.  Pure hypercholesterolemia -     simvastatin (ZOCOR) 40 MG tablet; Take 1 tablet (40  mg total) by mouth at bedtime.  Decreased ROM of neck  Neck pain  Paresthesia of right upper extremity   Return June for physical, fasting labs.

## 2016-02-19 ENCOUNTER — Telehealth: Payer: Self-pay | Admitting: Medical

## 2016-02-19 MED ORDER — PREDNISONE 20 MG PO TABS: ORAL_TABLET | ORAL | Status: DC

## 2016-02-19 NOTE — Telephone Encounter (Signed)
Sent electronically 

## 2016-02-19 NOTE — Telephone Encounter (Signed)
Call out Prednisone 20mg , 3 tablets daily for 3 days, #9, no refill

## 2016-02-19 NOTE — Telephone Encounter (Signed)
Pt says he has poison ivy and he is working out of town. He has been using Ivarest cream that was suggested by pharmacist. Few of blisters bursted last night. Pt want to know what else stronger he can get to help. He cant get here for an appt since out of town. Call him at [phone] ext [phone] or cell#

## 2016-02-20 ENCOUNTER — Ambulatory Visit (HOSPITAL_COMMUNITY)
Admission: EM | Admit: 2016-02-20 | Discharge: 2016-02-20 | Disposition: A | Discharge status: Home / Self Care | Payer: BLUE CROSS/BLUE SHIELD | Attending: Emergency Medicine | Admitting: Emergency Medicine

## 2016-02-20 ENCOUNTER — Encounter (HOSPITAL_COMMUNITY): Payer: Self-pay | Admitting: Emergency Medicine

## 2016-02-20 DIAGNOSIS — L259 Unspecified contact dermatitis, unspecified cause: Secondary | ICD-10-CM | POA: Diagnosis not present

## 2016-02-20 DIAGNOSIS — L237 Allergic contact dermatitis due to plants, except food: Secondary | ICD-10-CM | POA: Diagnosis not present

## 2016-02-20 MED ORDER — TRIAMCINOLONE ACETONIDE 0.1 % EX CREA: 1.0000 "application " | TOPICAL_CREAM | Freq: Two times a day (BID) | CUTANEOUS | Status: DC

## 2016-02-20 MED ORDER — PREDNISONE 20 MG PO TABS: ORAL_TABLET | ORAL | Status: DC

## 2016-02-20 NOTE — ED Provider Notes (Signed)
CSN: [id number]     Arrival date & time 02/20/16  1441 History   First MD Initiated Contact with Patient 02/20/16 1545     Chief Complaint  Patient presents with  . Poison Ivy   (Consider location/radiation/quality/duration/timing/severity/associated sxs/prior Treatment) HPI Comments: 65 year old male complaining of itchy rash to bilateral arms for approximately one week. He states he was cleaning out some old brush last weekend and then little after 1 day developed the rash.It is gradually spreading from his hands and wrist to the elbows. He denies other areas of involvement. Denies systemic symptoms. He has been using OTC calamine type lotion for partial relief.  Patient is a 65 y.o. male presenting with poison ivy.  Poison Ivy    Past Medical History  Diagnosis Date  . Allergy   . GERD (gastroesophageal reflux disease)   . Guillain-Barre syndrome ([place]) 1995  . Former smoker   . Anxiety     been on Tranxene long term, qd to BID  . Paresthesia of foot     left great toe since [employer]  . Mixed dyslipidemia   . Obesity   . Hypertension    Past Surgical History  Procedure Laterality Date  . Tonsillectomy    . Colonoscopy  2006   Family History  Problem Relation Age of Onset  . Arthritis Other   . Hyperlipidemia Other   . Heart disease Mother 55    aortic valve replacement, CABG 2 vessel  . Cancer Sister     lymphoma twice, childhood and now as adult   Social History  Substance Use Topics  . Smoking status: Never Smoker   . Smokeless tobacco: Never Used  . Alcohol Use: 0.6 oz/week    1 Cans of beer per week    Review of Systems  Constitutional: Negative.   HENT: Negative.   Respiratory: Negative.   Skin: Positive for rash.  Neurological: Negative.     Allergies  Lipitor and Niacin and related  Home Medications   Prior to Admission medications   Medication Sig Start Date End Date Taking? Authorizing Provider  Ascorbic Acid (VITAMIN C PO) Take by  mouth.     Yes Historical Provider, MD  aspirin 81 MG tablet Take 81 mg by mouth daily.     Yes Historical Provider, MD  atenolol (TENORMIN) 50 MG tablet Take 1 tablet (50 mg total) by mouth 2 (two) times daily. 12/31/15  Yes Camelia Eng Tysinger, PA-C  Calcium Carbonate-Vitamin D (CALCIUM-VITAMIN D) 500-200 MG-UNIT tablet Take 1 tablet by mouth daily. Reported on 12/31/2015   Yes Historical Provider, MD  clorazepate (TRANXENE) 7.5 MG tablet Take 1 tablet (7.5 mg total) by mouth 2 (two) times daily as needed for anxiety. 12/31/15  Yes Camelia Eng Tysinger, PA-C  Multiple Vitamin (MULTIVITAMIN) tablet Take 1 tablet by mouth daily.     Yes Historical Provider, MD  simvastatin (ZOCOR) 40 MG tablet Take 1 tablet (40 mg total) by mouth at bedtime. 12/31/15  Yes Camelia Eng Tysinger, PA-C  Vitamin D, Ergocalciferol, (DRISDOL) 50000 UNITS CAPS capsule Take 1 capsule (50,000 Units total) by mouth every 7 (seven) days. 09/23/14  Yes Camelia Eng Tysinger, PA-C  cyanocobalamin 2000 MCG tablet Take 3,000 mcg by mouth daily.    Historical Provider, MD  predniSONE (DELTASONE) 20 MG tablet 3 Tabs PO Days 1-3, then 2 tabs PO Days 4-6, then 1 tab PO Day 7-9, then Half Tab PO Day 10-12. Take with food. 02/20/16   Janne Napoleon, NP  triamcinolone cream (KENALOG) 0.1 % Apply 1 application topically 2 (two) times daily. 02/20/16   Janne Napoleon, NP   Meds Ordered and Administered this Visit  Medications - No data to display  BP 152/74 mmHg  Pulse 87  Temp(Src) 98.1 F (36.7 C) (Oral)  Resp 16  SpO2 95% No data found.   Physical Exam  Constitutional: He is oriented to person, place, and time. He appears well-developed and well-nourished. No distress.  Eyes: EOM are normal.  Neck: Normal range of motion. Neck supple.  Cardiovascular: Normal rate.   Pulmonary/Chest: Effort normal. No respiratory distress.  Musculoskeletal: He exhibits no edema.  Neurological: He is alert and oriented to person, place, and time. He exhibits normal muscle  tone.  Skin: Skin is warm and dry.  Patchy, erythematous, pruritic papular vesicular rash to the wrists and forearms bilaterally.some areas are open and draining clear transudate. No signs of infection. No lymphangitis.  Psychiatric: He has a normal mood and affect.  Nursing note and vitals reviewed.   ED Course  Procedures (including critical care time)  Labs Review Labs Reviewed - No data to display  Imaging Review No results found.   Visual Acuity Review  Right Eye Distance:   Left Eye Distance:   Bilateral Distance:    Right Eye Near:   Left Eye Near:    Bilateral Near:         MDM   1. Contact dermatitis   2. Allergic dermatitis due to poison ivy    Meds ordered this encounter  Medications  . triamcinolone cream (KENALOG) 0.1 %    Sig: Apply 1 application topically 2 (two) times daily.    Dispense:  30 g    Refill:  0    Order Specific Question:  Supervising Provider    Answer:  Melony Overly [id number]  . predniSONE (DELTASONE) 20 MG tablet    Sig: 3 Tabs PO Days 1-3, then 2 tabs PO Days 4-6, then 1 tab PO Day 7-9, then Half Tab PO Day 10-12. Take with food.    Dispense:  20 tablet    Refill:  0    Order Specific Question:  Supervising Provider    Answer:  Melony Overly [id number]   May also use the OTC meds currently using. Watch for infection.    Janne Napoleon, NP 02/20/16 1622

## 2016-02-20 NOTE — Discharge Instructions (Signed)
Contact Dermatitis Dermatitis is redness, soreness, and swelling (inflammation) of the skin. Contact dermatitis is a reaction to certain substances that touch the skin. There are two types of contact dermatitis:   Irritant contact dermatitis. This type is caused by something that irritates your skin, such as dry hands from washing them too much. This type does not require previous exposure to the substance for a reaction to occur. This type is more common.  Allergic contact dermatitis. This type is caused by a substance that you are allergic to, such as a nickel allergy or poison ivy. This type only occurs if you have been exposed to the substance (allergen) before. Upon a repeat exposure, your body reacts to the substance. This type is less common. CAUSES  Many different substances can cause contact dermatitis. Irritant contact dermatitis is most commonly caused by exposure to:   Makeup.   Soaps.   Detergents.   Bleaches.   Acids.   Metal salts, such as nickel.  Allergic contact dermatitis is most commonly caused by exposure to:   Poisonous plants.   Chemicals.   Jewelry.   Latex.   Medicines.   Preservatives in products, such as clothing.  RISK FACTORS This condition is more likely to develop in:   People who have jobs that expose them to irritants or allergens.  People who have certain medical conditions, such as asthma or eczema.  SYMPTOMS  Symptoms of this condition may occur anywhere on your body where the irritant has touched you or is touched by you. Symptoms include:  Dryness or flaking.   Redness.   Cracks.   Itching.   Pain or a burning feeling.   Blisters.  Drainage of small amounts of blood or clear fluid from skin cracks. With allergic contact dermatitis, there may also be swelling in areas such as the eyelids, mouth, or genitals.  DIAGNOSIS  This condition is diagnosed with a medical history and physical exam. A patch skin test  may be performed to help determine the cause. If the condition is related to your job, you may need to see an occupational medicine specialist. TREATMENT Treatment for this condition includes figuring out what caused the reaction and protecting your skin from further contact. Treatment may also include:   Steroid creams or ointments. Oral steroid medicines may be needed in more severe cases.  Antibiotics or antibacterial ointments, if a skin infection is present.  Antihistamine lotion or an antihistamine taken by mouth to ease itching.  A bandage (dressing). HOME CARE INSTRUCTIONS Skin Care  Moisturize your skin as needed.   Apply cool compresses to the affected areas.  Try taking a bath with:  Epsom salts. Follow the instructions on the packaging. You can get these at your local pharmacy or grocery store.  Baking soda. Pour a small amount into the bath as directed by your health care provider.  Colloidal oatmeal. Follow the instructions on the packaging. You can get this at your local pharmacy or grocery store.  Try applying baking soda paste to your skin. Stir water into baking soda until it reaches a paste-like consistency.  Do not scratch your skin.  Bathe less frequently, such as every other day.  Bathe in lukewarm water. Avoid using hot water. Medicines  Take or apply over-the-counter and prescription medicines only as told by your health care provider.   If you were prescribed an antibiotic medicine, take or apply your antibiotic as told by your health care provider. Do not stop using the  antibiotic even if your condition starts to improve. General Instructions  Keep all follow-up visits as told by your health care provider. This is important.  Avoid the substance that caused your reaction. If you do not know what caused it, keep a journal to try to track what caused it. Write down:  What you eat.  What cosmetic products you use.  What you drink.  What  you wear in the affected area. This includes jewelry.  If you were given a dressing, take care of it as told by your health care provider. This includes when to change and remove it. SEEK MEDICAL CARE IF:   Your condition does not improve with treatment.  Your condition gets worse.  You have signs of infection such as swelling, tenderness, redness, soreness, or warmth in the affected area.  You have a fever.  You have new symptoms. SEEK IMMEDIATE MEDICAL CARE IF:   You have a severe headache, neck pain, or neck stiffness.  You vomit.  You feel very sleepy.  You notice red streaks coming from the affected area.  Your bone or joint underneath the affected area becomes painful after the skin has healed.  The affected area turns darker.  You have difficulty breathing.   This information is not intended to replace advice given to you by your health care provider. Make sure you discuss any questions you have with your health care provider.   Document Released: 10/14/2000 Document Revised: 07/08/2015 Document Reviewed: 03/04/2015 Elsevier Interactive Patient Education 2016 [place] ivy is a inflammation of the skin (contact dermatitis) caused by touching the allergens on the leaves of the ivy plant following previous exposure to the plant. The rash usually appears 48 hours after exposure. The rash is usually bumps (papules) or blisters (vesicles) in a linear pattern. Depending on your own sensitivity, the rash may simply cause redness and itching, or it may also progress to blisters which may break open. These must be well cared for to prevent secondary bacterial (germ) infection, followed by scarring. Keep any open areas dry, clean, dressed, and covered with an antibacterial ointment if needed. The eyes may also get puffy. The puffiness is worst in the morning and gets better as the day progresses. This dermatitis usually heals without scarring, within 2 to 3  weeks without treatment. HOME CARE INSTRUCTIONS  Thoroughly wash with soap and water as soon as you have been exposed to poison ivy. You have about one half hour to remove the plant resin before it will cause the rash. This washing will destroy the oil or antigen on the skin that is causing, or will cause, the rash. Be sure to wash under your fingernails as any plant resin there will continue to spread the rash. Do not rub skin vigorously when washing affected area. Poison ivy cannot spread if no oil from the plant remains on your body. A rash that has progressed to weeping sores will not spread the rash unless you have not washed thoroughly. It is also important to wash any clothes you have been wearing as these may carry active allergens. The rash will return if you wear the unwashed clothing, even several days later. Avoidance of the plant in the future is the best measure. Poison ivy plant can be recognized by the number of leaves. Generally, poison ivy has three leaves with flowering branches on a single stem. Diphenhydramine may be purchased over the counter and used as needed for itching. Do not  drive with this medication if it makes you drowsy.Ask your caregiver about medication for children. SEEK MEDICAL CARE IF:  Open sores develop.  Redness spreads beyond area of rash.  You notice purulent (pus-like) discharge.  You have increased pain.  Other signs of infection develop (such as fever).   This information is not intended to replace advice given to you by your health care provider. Make sure you discuss any questions you have with your health care provider.   Document Released: 10/14/2000 Document Revised: 01/09/2012 Document Reviewed: 03/25/2015 Elsevier Interactive Patient Education [employer].

## 2016-02-20 NOTE — ED Notes (Signed)
Reports poison ivy exposure on Sunday and rash appeared on bilateral arms  Taking OTC medication for poison ivy... A&O x4... No acute distress.

## 2016-04-04 ENCOUNTER — Encounter: Payer: Self-pay | Admitting: Medical

## 2016-04-04 ENCOUNTER — Ambulatory Visit (INDEPENDENT_AMBULATORY_CARE_PROVIDER_SITE_OTHER): Payer: BLUE CROSS/BLUE SHIELD | Admitting: Medical

## 2016-04-04 VITALS — BP 128/90 | HR 72 | Resp 16 | Ht 70.0 in | Wt 230.2 lb

## 2016-04-04 DIAGNOSIS — Z0000 Encounter for general adult medical examination without abnormal findings: Secondary | ICD-10-CM | POA: Diagnosis not present

## 2016-04-04 DIAGNOSIS — E559 Vitamin D deficiency, unspecified: Secondary | ICD-10-CM

## 2016-04-04 DIAGNOSIS — R202 Paresthesia of skin: Secondary | ICD-10-CM

## 2016-04-04 DIAGNOSIS — I10 Essential (primary) hypertension: Secondary | ICD-10-CM

## 2016-04-04 DIAGNOSIS — F411 Generalized anxiety disorder: Secondary | ICD-10-CM

## 2016-04-04 DIAGNOSIS — R972 Elevated prostate specific antigen [PSA]: Secondary | ICD-10-CM

## 2016-04-04 DIAGNOSIS — F419 Anxiety disorder, unspecified: Secondary | ICD-10-CM | POA: Diagnosis not present

## 2016-04-04 DIAGNOSIS — Z8669 Personal history of other diseases of the nervous system and sense organs: Secondary | ICD-10-CM | POA: Diagnosis not present

## 2016-04-04 DIAGNOSIS — Z125 Encounter for screening for malignant neoplasm of prostate: Secondary | ICD-10-CM | POA: Diagnosis not present

## 2016-04-04 DIAGNOSIS — G478 Other sleep disorders: Secondary | ICD-10-CM

## 2016-04-04 DIAGNOSIS — E782 Mixed hyperlipidemia: Secondary | ICD-10-CM

## 2016-04-04 DIAGNOSIS — K219 Gastro-esophageal reflux disease without esophagitis: Secondary | ICD-10-CM

## 2016-04-04 DIAGNOSIS — R208 Other disturbances of skin sensation: Secondary | ICD-10-CM | POA: Insufficient documentation

## 2016-04-04 DIAGNOSIS — M542 Cervicalgia: Secondary | ICD-10-CM

## 2016-04-04 DIAGNOSIS — Z1211 Encounter for screening for malignant neoplasm of colon: Secondary | ICD-10-CM

## 2016-04-04 DIAGNOSIS — E669 Obesity, unspecified: Secondary | ICD-10-CM

## 2016-04-04 DIAGNOSIS — K4090 Unilateral inguinal hernia, without obstruction or gangrene, not specified as recurrent: Secondary | ICD-10-CM

## 2016-04-04 DIAGNOSIS — R0683 Snoring: Secondary | ICD-10-CM

## 2016-04-04 LAB — LIPID PANEL
Cholesterol: 159 mg/dL (ref 125–200)
HDL: 41 mg/dL (ref >=40)
LDL Cholesterol: 92 mg/dL (ref <130)
Total CHOL/HDL Ratio: 3.9 Ratio (ref <=5.0)
Triglycerides: 132 mg/dL (ref <150)
VLDL: 26 mg/dL (ref <30)

## 2016-04-04 LAB — COMPREHENSIVE METABOLIC PANEL
ALT: 30 U/L (ref 9–46)
AST: 21 U/L (ref 10–35)
Albumin: 4.3 g/dL (ref 3.6–5.1)
Alkaline Phosphatase: 37 U/L — ABNORMAL LOW (ref 40–115)
BUN: 12 mg/dL (ref 7–25)
CO2: 28 mmol/L (ref 20–31)
Calcium: 9.5 mg/dL (ref 8.6–10.3)
Chloride: 101 mmol/L (ref 98–110)
Creat: 0.81 mg/dL (ref 0.70–1.25)
Glucose, Bld: 103 mg/dL — ABNORMAL HIGH (ref 65–99)
Potassium: 4.4 mmol/L (ref 3.5–5.3)
Sodium: 137 mmol/L (ref 135–146)
Total Bilirubin: 0.7 mg/dL (ref 0.2–1.2)
Total Protein: 6.7 g/dL (ref 6.1–8.1)

## 2016-04-04 LAB — CBC
HCT: 44.7 % (ref 38.5–50.0)
Hemoglobin: 15.5 g/dL (ref 13.2–17.1)
MCH: 30.9 pg (ref 27.0–33.0)
MCHC: 34.7 g/dL (ref 32.0–36.0)
MCV: 89 fL (ref 80.0–100.0)
MPV: 9.4 fL (ref 7.5–12.5)
Platelets: 212 10*3/uL (ref 140–400)
RBC: 5.02 MIL/uL (ref 4.20–5.80)
RDW: 13.4 % (ref 11.0–15.0)
WBC: 8.4 10*3/uL (ref 4.0–10.5)

## 2016-04-04 LAB — POCT URINALYSIS DIPSTICK
BILIRUBIN UA: NEGATIVE
Blood, UA: NEGATIVE
GLUCOSE UA: NEGATIVE
Ketones, UA: NEGATIVE
Leukocytes, UA: NEGATIVE
Nitrite, UA: NEGATIVE
Protein, UA: NEGATIVE
SPEC GRAV UA: 1.025
UROBILINOGEN UA: NEGATIVE
pH, UA: 6

## 2016-04-04 LAB — HEMOGLOBIN A1C
HEMOGLOBIN A1C: 5.9 % — AB (ref <5.7)
MEAN PLASMA GLUCOSE: 123 mg/dL

## 2016-04-04 NOTE — Progress Notes (Signed)
Subjective:   HPI  Jesus Burke is a 65 y.o. male who presents for a complete physical.  Medical care team includes: Crisoforo Oxford, PA-C here for primary care Neurology in [facility] recently for right hand issues Sees dentist and eye doctor   Concerns: Having some recent problems with right hand, seeing neurology, possible C spine nerve root impingement  Wants to have sleep study.  Thinks he has this given snoring, non restful sleep, has daytime somnolence  Reviewed their medical, surgical, family, social, medication, and allergy history and updated chart as appropriate.  Past Medical History  Diagnosis Date  . Allergy   . GERD (gastroesophageal reflux disease)   . 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 Former smoker   . Anxiety     been on Tranxene long term, qd to BID  . Paresthesia of foot     left great toe since [employer]  . Mixed dyslipidemia   . Obesity   . Hypertension     Past Surgical History  Procedure Laterality Date  . Tonsillectomy    . Colonoscopy  2006    Social History   Social History  . Marital Status: Single    Spouse Name: N/A  . Number of Children: N/A  . Years of Education: N/A   Occupational History  . optician    Social History Main Topics  . Smoking status: Never Smoker   . Smokeless tobacco: Never Used  . Alcohol Use: 0.6 oz/week    1 Cans of beer per week  . Drug Use: No  . Sexual Activity: Not Currently   Other Topics Concern  . 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 Arthritis Other   . Hyperlipidemia Other   . Heart disease Mother 54    aortic valve replacement, CABG 2 vessel  . Cancer Sister     lymphoma twice, childhood and now as adult     Current outpatient prescriptions:  .  Ascorbic Acid  (VITAMIN C PO), Take by mouth.  , Disp: , Rfl:  .  aspirin 81 MG tablet, Take 81 mg by mouth daily.  , Disp: , Rfl:  .  atenolol (TENORMIN) 50 MG tablet, Take 1 tablet (50 mg total) by mouth 2 (two) times daily., Disp: 180 tablet, Rfl: 1 .  calcium carbonate (OS-CAL) 600 MG tablet, Take 600 mg by mouth., Disp: , Rfl:  .  Cholecalciferol (VITAMIN D) 2000 units tablet, Take 2,000 Units by mouth daily., Disp: , Rfl:  .  clorazepate (TRANXENE) 7.5 MG tablet, Take 1 tablet (7.5 mg total) by mouth 2 (two) times daily as needed for anxiety., Disp: 120 tablet, Rfl: 2 .  Multiple Vitamin (MULTIVITAMIN) tablet, Take 1 tablet by mouth daily.  , Disp: , Rfl:  .  niacin 250 MG tablet, Take 250 mg by mouth at bedtime., Disp: , Rfl:  .  simvastatin (ZOCOR) 40 MG tablet, Take 1 tablet (40 mg total) by mouth at bedtime., Disp: 90 tablet, Rfl: 1 .  [DISCONTINUED] omega-3 acid ethyl esters (LOVAZA) 1 G capsule, Take 2 capsules (2 g total) by mouth 2 (two) times daily. (Patient not taking: Reported on 11/16/2015), Disp: 120 capsule, Rfl: 11  Current facility-administered medications:  .  TDaP (BOOSTRIX) injection 0.5 mL, 0.5 mL, Intramuscular, Once, Janith Lima, MD  Allergies  Allergen Reactions  . Lipitor [Atorvastatin]   . Niacin And Related Other (See Comments)    Hot flash, red skin at higher doses, but tolerated 250mg     Review of Systems Constitutional: -fever, -chills, -sweats, -unexpected weight change, -decreased appetite, -fatigue Allergy: -sneezing, -itching, -congestion Dermatology: -changing moles, --rash, -lumps ENT: -runny nose, -ear pain, -sore throat, -hoarseness, -sinus pain, -teeth pain, - ringing in ears, -hearing loss, -nosebleeds Cardiology: -chest pain, -palpitations, -swelling, -difficulty breathing when lying flat, -waking up short of breath Respiratory: -cough, -shortness of breath, -difficulty breathing with exercise or exertion, -wheezing, -coughing up blood Gastroenterology:  -abdominal pain, -nausea, -vomiting, -diarrhea, -constipation, -blood in stool, -changes in bowel movement, -difficulty swallowing or eating Hematology: -bleeding, -bruising  Musculoskeletal: -muscle aches, -joint swelling, -back pain, -neck pain, -cramping, -changes in gait Ophthalmology: denies vision changes, eye redness, itching, discharge Urology: -burning with urination, -difficulty urinating, -blood in urine, -urinary frequency, -urgency, -incontinence Neurology: -headache, -weakness, -tingling, -numbness, -memory loss, -falls, -dizziness Psychology: -depressed mood, -agitation, -sleep problems     Objective:   Physical Exam  BP 128/90 mmHg  Pulse 72  Resp 16  Ht 5\' 10"  (1.778 m)  Wt 230 lb 3.2 oz (104.418 kg)  BMI 33.03 kg/m2  General appearance: alert, no distress, WD/WN, obese white male Skin: small AK superior parietal scalp on the left, scattered freckles of scalp and torso, male pattern baldness present, no other worrisome lesions HEENT: normocephalic, conjunctiva/corneas normal, sclerae anicteric, PERRLA, EOMi, nares patent, no discharge or erythema, pharynx normal Oral cavity: MMM, tongue normal, teeth in good repair Neck: supple, no lymphadenopathy, no thyromegaly, no masses, normal ROM, no bruits Chest: non tender, normal shape and expansion Heart: RRR, normal S1, S2, no murmurs Lungs: CTA bilaterally, no wheezes, rhonchi, or rales Abdomen: +bs, soft, non tender, non distended, no masses, no hepatomegaly, no splenomegaly, no bruits Back: non tender, normal ROM, no scoliosis Musculoskeletal: upper extremities non tender, no obvious deformity, normal ROM throughout, lower extremities non tender, no obvious deformity, normal ROM throughout Extremities: no edema, no cyanosis, no clubbing Pulses: 2+ symmetric, upper and lower extremities, normal cap refill Neurological: alert, oriented x 3, CN2-12 intact, strength normal upper extremities and lower extremities, sensation  normal throughout, DTRs 2+ throughout, no cerebellar signs, gait normal Psychiatric: normal affect, behavior normal, pleasant  GU: normal male external genitalia, circumcised, small right inguinal hernia, otherwise nontender, no masses, no lymphadenopathy Rectal: small external hemorrhoid anteriorly, skin tag posteriorly, prostate mildly enlarged, no nodules , occult negative stool   Assessment and Plan :   Encounter Diagnoses  Name Primary?  . Encounter for health maintenance examination in adult Yes  . Generalized anxiety disorder   . Essential hypertension   . Anxiety   . Obesity   . Neck pain   . Mixed dyslipidemia   . Vitamin D deficiency   . Screening for prostate cancer   . Special screening for malignant neoplasms, colon   . Paresthesia of foot, unspecified laterality   . History of Guillain-Barre syndrome   . Gastroesophageal reflux disease without esophagitis   . Non-restorative sleep   . Snoring   . Unilateral inguinal hernia without obstruction or gangrene, recurrence not specified     Physical exam - discussed healthy lifestyle, diet, exercise, preventative care, vaccinations, and addressed their concerns.   See your eye doctor yearly for routine vision care. See your dentist yearly for routine dental  care including hygiene visits twice yearly. See Dr. Nevada Crane dermatology for surveillance of skin He is due for colonoscopy.  He will call back when he wants to be referred.    He is still debating on where to get sleep study.   I offered referral, but he wants to check insurance coverage first Routine labs today C/t current medications New right inguinal hernia, small.   Discussed diagnosis, watch and wait approach for now. Follow-up pending labs

## 2016-04-04 NOTE — Addendum Note (Signed)
Addended by: Carolee Rota F on: 04/04/2016 12:01 PM   Modules accepted: Orders

## 2016-04-05 ENCOUNTER — Other Ambulatory Visit: Payer: Self-pay | Admitting: Medical

## 2016-04-05 DIAGNOSIS — E7800 Pure hypercholesterolemia, unspecified: Secondary | ICD-10-CM

## 2016-04-05 DIAGNOSIS — F411 Generalized anxiety disorder: Secondary | ICD-10-CM

## 2016-04-05 LAB — VITAMIN D 25 HYDROXY (VIT D DEFICIENCY, FRACTURES): Vit D, 25-Hydroxy: 29 ng/mL — ABNORMAL LOW (ref 30–100)

## 2016-04-05 LAB — PSA: PSA: 8 ng/mL — ABNORMAL HIGH (ref <=4.00)

## 2016-04-05 MED ORDER — ASPIRIN 81 MG PO TABS: 81.0000 mg | ORAL_TABLET | Freq: Every day | ORAL | Status: AC

## 2016-04-05 MED ORDER — VITAMIN D (ERGOCALCIFEROL) 1.25 MG (50000 UNIT) PO CAPS: 50000.0000 [IU] | ORAL_CAPSULE | ORAL | Status: AC

## 2016-04-05 MED ORDER — SIMVASTATIN 40 MG PO TABS: 40.0000 mg | ORAL_TABLET | Freq: Every day | ORAL | Status: DC

## 2016-04-05 MED ORDER — ATENOLOL 50 MG PO TABS: 50.0000 mg | ORAL_TABLET | Freq: Two times a day (BID) | ORAL | Status: DC

## 2016-04-05 NOTE — Addendum Note (Signed)
Addended by: Minette Headland A on: 04/05/2016 09:55 AM   Modules accepted: Orders, SmartSet

## 2016-04-07 ENCOUNTER — Encounter: Payer: Self-pay | Admitting: Internal Medicine

## 2016-04-08 ENCOUNTER — Telehealth: Payer: Self-pay

## 2016-04-08 NOTE — Telephone Encounter (Signed)
Pt request copy of labs faxed to his personal secure fax line. [phone]. Labs faxed. Jesus Burke

## 2016-04-11 ENCOUNTER — Telehealth: Payer: Self-pay

## 2016-04-11 NOTE — Telephone Encounter (Signed)
LMTCB

## 2016-04-11 NOTE — Telephone Encounter (Signed)
He has an elevated PSA.   This can be elevated with prostate enlargement infection and or prostate cancer.   Thus, we can't rule out cancer.   Thus, I strongly recommend the referral.

## 2016-04-11 NOTE — Telephone Encounter (Signed)
Spoke to pt on the 8th and he was fine with Urology referral but now he does not want to do the referral and would not make an appt with [facility] called him

## 2016-04-12 NOTE — Telephone Encounter (Signed)
Pt wants referral to dr Ruth[facility] and [facility] urology. It is in [place]. Faxing referral over

## 2016-04-12 NOTE — Telephone Encounter (Signed)
LMTCB

## 2016-04-20 ENCOUNTER — Telehealth: Payer: Self-pay | Admitting: Medical

## 2016-04-20 NOTE — Telephone Encounter (Signed)
Yes I would c/t niacin as long as he tolerates it

## 2016-04-20 NOTE — Telephone Encounter (Signed)
Pt called and wants to know if you want if you want him to contiune taking niacin 250 mg, he has another bottle ordered but wanted to know he if needed to take it. Pt can be reached at [phone] (M

## 2016-04-20 NOTE — Telephone Encounter (Signed)
Called and left message for pt

## 2016-09-12 ENCOUNTER — Other Ambulatory Visit: Payer: Self-pay | Admitting: Family Medicine

## 2016-09-12 DIAGNOSIS — F419 Anxiety disorder, unspecified: Secondary | ICD-10-CM

## 2016-09-12 NOTE — Telephone Encounter (Signed)
Jesus Burke is this okay to refill

## 2016-09-12 NOTE — Telephone Encounter (Signed)
Call out medication 

## 2016-10-04 ENCOUNTER — Telehealth: Payer: Self-pay

## 2016-10-04 NOTE — Telephone Encounter (Signed)
Records faxed to [facility] at [place] per pt request to [phone]. RLB

## 2020-06-01 ENCOUNTER — Ambulatory Visit: Payer: Medicare HMO | Admitting: Neurology

## 2020-06-01 ENCOUNTER — Encounter: Payer: Self-pay | Admitting: Neurology

## 2020-06-01 VITALS — BP 121/80 | HR 82 | Ht 71.0 in | Wt 207.5 lb

## 2020-06-01 DIAGNOSIS — M25511 Pain in right shoulder: Secondary | ICD-10-CM

## 2020-06-01 DIAGNOSIS — R202 Paresthesia of skin: Secondary | ICD-10-CM

## 2020-06-01 DIAGNOSIS — Z8669 Personal history of other diseases of the nervous system and sense organs: Secondary | ICD-10-CM | POA: Diagnosis not present

## 2020-06-01 DIAGNOSIS — M542 Cervicalgia: Secondary | ICD-10-CM | POA: Diagnosis not present

## 2020-06-01 DIAGNOSIS — G8929 Other chronic pain: Secondary | ICD-10-CM

## 2020-06-01 MED ORDER — PREGABALIN 150 MG PO CAPS: 150.0000 mg | ORAL_CAPSULE | Freq: Two times a day (BID) | ORAL | 5 refills | Status: DC

## 2020-06-01 MED ORDER — PREGABALIN 100 MG PO CAPS: 100.0000 mg | ORAL_CAPSULE | Freq: Two times a day (BID) | ORAL | 5 refills | Status: DC
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
## 2020-06-03 ENCOUNTER — Telehealth: Payer: Self-pay | Admitting: Neurology

## 2020-06-03 NOTE — Telephone Encounter (Signed)
[employer] medicare order sent to [facility]. They will obtain the auth and reach out to the patient to schedule.  °

## 2020-06-10 ENCOUNTER — Ambulatory Visit
Admission: RE | Admit: 2020-06-10 | Discharge: 2020-06-10 | Disposition: A | Discharge status: Home / Self Care | Payer: BLUE CROSS/BLUE SHIELD | Source: Ambulatory Visit | Attending: Neurology | Admitting: Neurology

## 2020-06-10 ENCOUNTER — Other Ambulatory Visit: Payer: Self-pay

## 2020-06-10 DIAGNOSIS — G8929 Other chronic pain: Secondary | ICD-10-CM

## 2020-06-10 DIAGNOSIS — R202 Paresthesia of skin: Secondary | ICD-10-CM

## 2020-06-10 DIAGNOSIS — M25511 Pain in right shoulder: Secondary | ICD-10-CM

## 2020-06-10 DIAGNOSIS — M542 Cervicalgia: Secondary | ICD-10-CM

## 2020-06-17 ENCOUNTER — Telehealth: Payer: Self-pay | Admitting: *Deleted

## 2020-06-17 DIAGNOSIS — M5412 Radiculopathy, cervical region: Secondary | ICD-10-CM

## 2020-06-17 NOTE — Telephone Encounter (Signed)
Called pt. Relayed results per Dr. Garth Bigness note. He verbalized understanding. States sx have not improved and would like to try ESI. I placed order and he is aware [facility] will reach out to get him scheduled. He will call back if he does not hear in the next week or so to get scheduled.

## 2020-06-17 NOTE — Telephone Encounter (Signed)
-----   Message from Britt Bottom, MD sent at 06/16/2020  6:27 PM EDT ----- Please let him know the MRI of the cervical spine shows spinal stenosis at C4C5 that could compress the C5 nerve root which could caus pain  in the shoulder and arm.   If not better, we can order ESI at that level

## 2020-06-22 NOTE — Telephone Encounter (Signed)
Called [place] back. She states insurance is wanting more information from Dr. Felecia Shelling before approving ESI. States Dr. Felecia Shelling can do P2P with [employer]Med[employer]are. Case# [id number]. Phone [phone]. Requester: [facility], referring physician: Dr. Felecia Shelling.

## 2020-06-22 NOTE — Telephone Encounter (Signed)
Called [place] back at [employer]. LVM to let her know Dr. Felecia Shelling called and provided more info to nurse to try and get ESI approved for tomorrow. Pending approval via MD. Waiting to hear back. Advised as soon as we hear, we will let them know.

## 2020-06-22 NOTE — Telephone Encounter (Signed)
[name] with [facility] called to speak with Jesus Burke regarding pt's appt tomorrow. Please call [phone].

## 2020-06-22 NOTE — Telephone Encounter (Signed)
I spoke with the intake coordinator nurse to give additional information including MRI results medications that the patient has tried and failed and that he has done PT and home exercise in the past  She will get the information to one of the insurance doctors and let us know the outcome and whether or not an actual peer-to-peer needs to be done

## 2020-06-23 ENCOUNTER — Inpatient Hospital Stay: Admission: RE | Admit: 2020-06-23 | Payer: BLUE CROSS/BLUE SHIELD | Source: Ambulatory Visit

## 2020-06-23 NOTE — Telephone Encounter (Signed)
Called [employer] at ([employer]). [phone] to check on status of case. Case# [id number]. Per automated system, case still in review. They will fax determination once completed.

## 2020-06-24 NOTE — Telephone Encounter (Signed)
Took call from phone staff and spoke with [name] with [facility]. She states she called [employer] back to see why Josem Kaufmann is still pending. They told her they need to know Dr. Garth Bigness after care plan after pt receives inj (oral therapy, patient education, physical therapy, psychosocial support(counseling)). She said if Dr. Felecia Shelling can put a note in epic, she will print it off and fax to them. Pt is r/s for tomorrow for the procedure.

## 2020-06-24 NOTE — Telephone Encounter (Signed)
I added an Addednum to my last note

## 2020-06-25 ENCOUNTER — Inpatient Hospital Stay: Admission: RE | Admit: 2020-06-25 | Payer: BLUE CROSS/BLUE SHIELD | Source: Ambulatory Visit

## 2020-06-26 ENCOUNTER — Other Ambulatory Visit: Payer: BLUE CROSS/BLUE SHIELD

## 2020-07-08 ENCOUNTER — Ambulatory Visit
Admission: RE | Admit: 2020-07-08 | Discharge: 2020-07-08 | Disposition: A | Discharge status: Home / Self Care | Payer: Medicare HMO | Source: Ambulatory Visit | Attending: Neurology | Admitting: Neurology

## 2020-07-08 ENCOUNTER — Other Ambulatory Visit: Payer: Self-pay

## 2020-07-08 DIAGNOSIS — M5412 Radiculopathy, cervical region: Secondary | ICD-10-CM

## 2020-07-08 MED ORDER — TRIAMCINOLONE ACETONIDE 40 MG/ML IJ SUSP (RADIOLOGY)
60.0000 mg | Freq: Once | INTRAMUSCULAR | Status: AC
  Administered 2020-07-08: 60 mg via EPIDURAL

## 2020-07-08 MED ORDER — IOPAMIDOL (ISOVUE-M 300) INJECTION 61%
1.0000 mL | Freq: Once | INTRAMUSCULAR | Status: AC | PRN
  Administered 2020-07-08: 1 mL via EPIDURAL

## 2020-07-08 NOTE — Discharge Instructions (Signed)

## 2020-07-17 ENCOUNTER — Telehealth: Payer: Self-pay | Admitting: Neurology

## 2020-07-17 DIAGNOSIS — M5412 Radiculopathy, cervical region: Secondary | ICD-10-CM

## 2020-07-17 NOTE — Telephone Encounter (Signed)
Pt called wanting to speak to provider regarding the inj he received at [facility]. Pt states since he received the inj he has been experiencing pain in his teeth and he is wanting to know if this has to do to why he feels this pain. Please advise.

## 2020-07-20 NOTE — Addendum Note (Signed)
Addended by: Wyvonnia Lora on: 07/20/2020 03:30 PM   Modules accepted: Orders

## 2020-07-20 NOTE — Telephone Encounter (Signed)
Called and spoke with patient. Relayed Dr. Garth Bigness message. He is agreeable to this plan. I placed order.

## 2020-07-20 NOTE — Telephone Encounter (Signed)
Called pt back. Relayed Dr. Garth Bigness message. He states they did find that a tooth he previously had root canal/crown on is now infected. He is being referred to [facility] for this.  He also wanted to report a new sx: yesterday his fingers on both hands were freezing up. Lasted about 10 min, it was intermittent. Denies any pain. Wondering if he needs to repeat ESI? He tolerated this procedure ok. Advised I will send message to MD and call back. He is aware and ok to call back tomorrow if need be.

## 2020-07-20 NOTE — Telephone Encounter (Signed)
We can set up another cervical esi as before

## 2020-07-20 NOTE — Telephone Encounter (Addendum)
Dr. Felecia Shelling- what is your suggestion? I reviewed his chart. He had inj 07/08/20 (ESI C4-C5)

## 2020-07-20 NOTE — Telephone Encounter (Signed)
Epidural steroid injection should not cause any pain at the teeth.  Would recommend that he see his dentist to make sure that there is not a dental cause of the pain

## 2020-08-06 ENCOUNTER — Other Ambulatory Visit: Payer: Self-pay

## 2020-08-06 ENCOUNTER — Ambulatory Visit
Admission: RE | Admit: 2020-08-06 | Discharge: 2020-08-06 | Disposition: A | Discharge status: Home / Self Care | Payer: Medicare HMO | Source: Ambulatory Visit | Attending: Neurology | Admitting: Neurology

## 2020-08-06 DIAGNOSIS — M5412 Radiculopathy, cervical region: Secondary | ICD-10-CM

## 2020-08-06 MED ORDER — TRIAMCINOLONE ACETONIDE 40 MG/ML IJ SUSP (RADIOLOGY)
60.0000 mg | Freq: Once | INTRAMUSCULAR | Status: AC
  Administered 2020-08-06: 60 mg via EPIDURAL

## 2020-08-06 MED ORDER — IOPAMIDOL (ISOVUE-M 300) INJECTION 61%
1.0000 mL | Freq: Once | INTRAMUSCULAR | Status: AC | PRN
  Administered 2020-08-06: 1 mL via EPIDURAL

## 2020-08-06 NOTE — Discharge Instructions (Signed)

## 2020-12-16 ENCOUNTER — Other Ambulatory Visit: Payer: Self-pay | Admitting: Neurology

## 2021-01-25 IMAGING — XA DG INJECT/[PERSON_NAME] INC NEEDLE/CATH/PLC EPI/CERV/THOR W/IMG
2 series · 2 of 2 positions shown · non-contrast
Comparison: none

CLINICAL DATA: Cervical spondylosis without myelopathy. Right-sided
neck pain radiating into the right shoulder, right arm, and upper
chest. Diffuse cervical disc degeneration. Given the lack of any
significant visible dorsal epidural fat in the lower cervical spine
on MRI as well as an asymmetrically thin ligamentum flavum on the
right at C7-T1 with resultant increased risk of an inadvertent dural
puncture, today's injection will be performed at T1-2.

[Series 1: ortho adipose · 1 of 1 slices shown (1 of 2)]
[im 1/1]
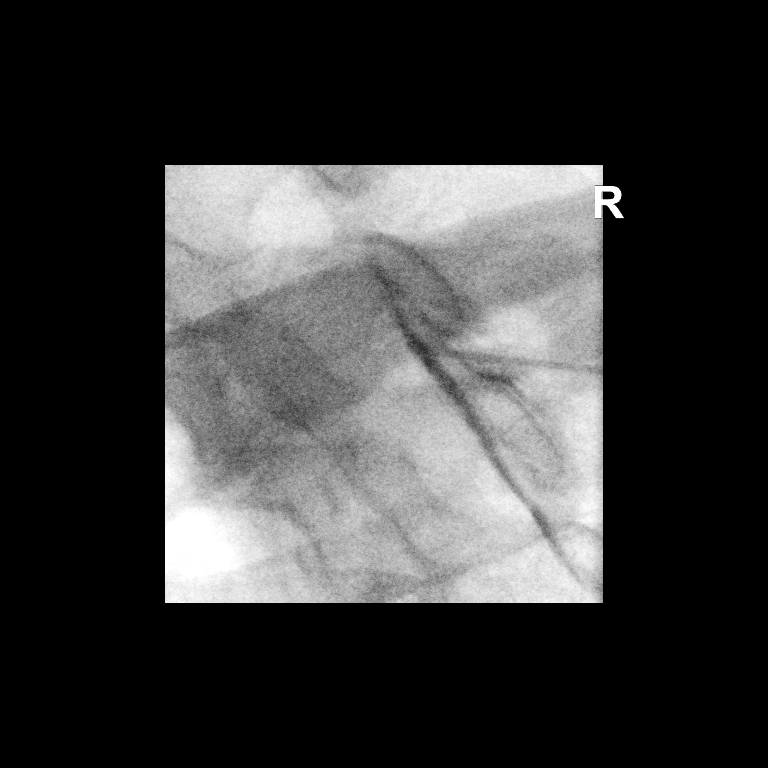

[Series 2: ortho adipose · 1 of 1 slices shown (2 of 2)]
[im 1/1]
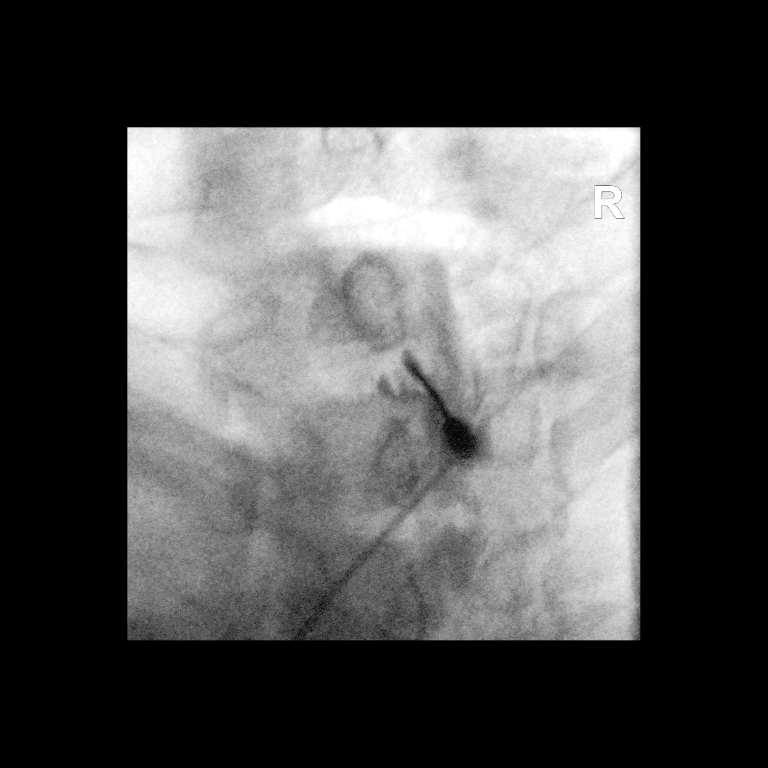

[2 of 2 positions shown; findings below may reference images not displayed]

FLUOROSCOPY TIME:  Fluoroscopy Time: 21 seconds

Radiation Exposure Index: 13.87 microGray*m^2

PROCEDURE:
The procedure, risks, benefits, and alternatives were explained to
the patient. Questions regarding the procedure were encouraged and
answered. The patient understands and consents to the procedure.

CERVICOTHORACIC EPIDURAL INJECTION

An interlaminar approach was performed on the right at T1-2. A
inch 20 gauge epidural needle was advanced using loss-of-resistance
technique.

DIAGNOSTIC EPIDURAL INJECTION

Injection of Isovue-M 300 shows a good epidural pattern with spread
above and below the level of needle placement, primarily on the
right. No vascular opacification is seen.

THERAPEUTIC EPIDURAL INJECTION

1.5 ml of Kenalog 40 mixed with 2 ml of normal saline were then
instilled. The procedure was well-tolerated, and the patient was
discharged thirty minutes following the injection in good condition.
IMPRESSION: Technically successful interlaminar epidural injection on the right
at T1-2.

## 2021-07-06 ENCOUNTER — Encounter: Payer: Self-pay | Admitting: Neurology

## 2021-07-06 ENCOUNTER — Ambulatory Visit: Payer: Medicare Other | Admitting: Neurology

## 2021-07-06 VITALS — BP 159/104 | HR 80 | Ht 71.0 in | Wt 197.5 lb

## 2021-07-06 DIAGNOSIS — Z8669 Personal history of other diseases of the nervous system and sense organs: Secondary | ICD-10-CM | POA: Diagnosis not present

## 2021-07-06 DIAGNOSIS — M5412 Radiculopathy, cervical region: Secondary | ICD-10-CM

## 2021-07-06 DIAGNOSIS — R202 Paresthesia of skin: Secondary | ICD-10-CM

## 2021-07-06 DIAGNOSIS — M542 Cervicalgia: Secondary | ICD-10-CM

## 2021-07-06 DIAGNOSIS — H9041 Sensorineural hearing loss, unilateral, right ear, with unrestricted hearing on the contralateral side: Secondary | ICD-10-CM

## 2021-07-06 MED ORDER — PREGABALIN 150 MG PO CAPS: 150.0000 mg | ORAL_CAPSULE | Freq: Two times a day (BID) | ORAL | 5 refills | Status: DC
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
## 2021-07-07 ENCOUNTER — Telehealth: Payer: Self-pay | Admitting: Neurology

## 2021-07-07 NOTE — Telephone Encounter (Signed)
[employer] no auth require order sent to GI. They will reach out to the patient to schedule.

## 2021-07-19 ENCOUNTER — Ambulatory Visit
Admission: RE | Admit: 2021-07-19 | Discharge: 2021-07-19 | Disposition: A | Discharge status: Home / Self Care | Payer: Medicare Other | Source: Ambulatory Visit | Attending: Neurology | Admitting: Neurology

## 2021-07-19 DIAGNOSIS — H9041 Sensorineural hearing loss, unilateral, right ear, with unrestricted hearing on the contralateral side: Secondary | ICD-10-CM

## 2021-07-19 MED ORDER — GADOBENATE DIMEGLUMINE 529 MG/ML IV SOLN
18.0000 mL | Freq: Once | INTRAVENOUS | Status: AC | PRN
  Administered 2021-07-19: 18 mL via INTRAVENOUS

## 2021-07-20 ENCOUNTER — Telehealth: Payer: Self-pay | Admitting: *Deleted

## 2021-07-20 DIAGNOSIS — H9041 Sensorineural hearing loss, unilateral, right ear, with unrestricted hearing on the contralateral side: Secondary | ICD-10-CM

## 2021-07-20 NOTE — Telephone Encounter (Signed)
-----   Message from Britt Bottom, MD sent at 07/20/2021  8:32 AM EDT ----- Please let him know that the MRI of the brain showed age-related changes.  \  The nerves between the inner ears and the brain appeared normal.  Because of the reduced hearing, he should see ENT and if he has not done so we can set up a consult (right hearing loss and tinnitus)

## 2021-07-20 NOTE — Telephone Encounter (Signed)
Called and spoke with pt. Relayed results per Dr. Garth Bigness note. Pt verbalized understanding. He would like referral to ENT. Referral placed. He will call if he does not hear about getting scheduled in the next couple weeks.

## 2021-07-22 ENCOUNTER — Telehealth: Payer: Self-pay | Admitting: Neurology

## 2021-07-22 NOTE — Telephone Encounter (Signed)
[facility] ph # [phone].

## 2022-01-03 ENCOUNTER — Ambulatory Visit: Payer: Medicare Other | Admitting: Neurology

## 2022-01-03 ENCOUNTER — Encounter: Payer: Self-pay | Admitting: Neurology

## 2022-02-07 ENCOUNTER — Other Ambulatory Visit: Payer: Self-pay | Admitting: Neurology

## 2022-02-08 NOTE — Telephone Encounter (Signed)
Last OV was on 07/06/22.  ?Next OV is scheduled for 06/23/22.  ?Last RX was written on 01/02/22 for 60 tabs.  ? ?[place] [facility] Database has been reviewed.  ?

## 2022-03-03 ENCOUNTER — Encounter: Payer: Self-pay | Admitting: Family Medicine

## 2022-03-03 ENCOUNTER — Ambulatory Visit: Payer: Medicare Other | Admitting: Family Medicine

## 2022-03-16 ENCOUNTER — Ambulatory Visit: Payer: Medicare Other | Admitting: Neurology

## 2022-03-16 ENCOUNTER — Encounter: Payer: Self-pay | Admitting: Neurology

## 2022-03-16 VITALS — BP 134/88 | HR 88 | Ht 71.0 in | Wt 210.4 lb

## 2022-03-16 DIAGNOSIS — Z8669 Personal history of other diseases of the nervous system and sense organs: Secondary | ICD-10-CM

## 2022-03-16 DIAGNOSIS — H9041 Sensorineural hearing loss, unilateral, right ear, with unrestricted hearing on the contralateral side: Secondary | ICD-10-CM | POA: Diagnosis not present

## 2022-03-16 DIAGNOSIS — M5412 Radiculopathy, cervical region: Secondary | ICD-10-CM | POA: Diagnosis not present

## 2022-03-16 DIAGNOSIS — M542 Cervicalgia: Secondary | ICD-10-CM | POA: Diagnosis not present

## 2022-03-16 MED ORDER — PREGABALIN 150 MG PO CAPS: ORAL_CAPSULE | ORAL | 5 refills | Status: DC
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
## 2022-04-05 ENCOUNTER — Ambulatory Visit: Payer: Medicare Other | Admitting: Neurology

## 2022-06-23 ENCOUNTER — Telehealth: Payer: Self-pay | Admitting: Neurology

## 2022-06-23 ENCOUNTER — Ambulatory Visit: Payer: Medicare Other | Admitting: Neurology

## 2022-06-23 NOTE — Telephone Encounter (Signed)
Sent phone message

## 2022-06-23 NOTE — Telephone Encounter (Signed)
Ladies this patient had an appt today was cancelled because he came in May. He wanted to discuss Dr. Felecia Shelling referred him to Ortho and he thought he needed to come back in sooner. He still feels his balance is off and wanted to just confirm if needs to be seen sooner.

## 2022-07-04 ENCOUNTER — Other Ambulatory Visit: Payer: Self-pay | Admitting: Neurology

## 2022-09-24 ENCOUNTER — Other Ambulatory Visit: Payer: Self-pay | Admitting: Neurology

## 2022-09-26 NOTE — Telephone Encounter (Signed)
Patient is due for a refill on Lyrica. Patient is up to date on his appointments. [place] Controlled Substance Registry checked and is appropriate.

## 2022-11-10 ENCOUNTER — Encounter (HOSPITAL_COMMUNITY): Payer: Self-pay

## 2022-11-10 ENCOUNTER — Ambulatory Visit (HOSPITAL_COMMUNITY)
Admission: EM | Admit: 2022-11-10 | Discharge: 2022-11-10 | Disposition: A | Discharge status: Home / Self Care | Payer: Medicare Other | Attending: Urgent Care | Admitting: Urgent Care

## 2022-11-10 ENCOUNTER — Ambulatory Visit (INDEPENDENT_AMBULATORY_CARE_PROVIDER_SITE_OTHER): Payer: Medicare Other

## 2022-11-10 DIAGNOSIS — S2232XA Fracture of one rib, left side, initial encounter for closed fracture: Secondary | ICD-10-CM

## 2022-11-10 DIAGNOSIS — R071 Chest pain on breathing: Secondary | ICD-10-CM | POA: Diagnosis not present

## 2022-11-10 MED ORDER — HYDROCODONE-ACETAMINOPHEN 5-325 MG PO TABS: 1.0000 | ORAL_TABLET | Freq: Four times a day (QID) | ORAL | 0 refills | Status: AC | PRN

## 2022-11-10 MED ORDER — KETOROLAC TROMETHAMINE 30 MG/ML IJ SOLN
INTRAMUSCULAR | Status: AC
  Filled 2022-11-10: qty 1

## 2022-11-10 MED ORDER — KETOROLAC TROMETHAMINE 30 MG/ML IJ SOLN
30.0000 mg | Freq: Once | INTRAMUSCULAR | Status: AC
  Administered 2022-11-10: 30 mg via INTRAMUSCULAR

## 2022-11-10 NOTE — ED Provider Notes (Signed)
Jesus Burke    CSN: [id number] Arrival date & time: 11/10/22  1537      History   Chief Complaint Chief Complaint  Patient presents with   Leg Pain   Fall   Back Pain    HPI Jesus Burke is a 72 y.o. male.   Pleasant 72 year old male presents today due to concerns of left sided thoracic back pain.  He was in a hurry trying to get ready for doctors appointment at the [place], when he tripped over his dog.  He fell and landed with his back directly hitting a cement curb.  He reports pain to palpation around the area on his left T4-5 rib region, and states that deep breaths causes severe pain as well.  Patient also has an abrasion that is bleeding to his left lower side.  Patient does take aspirin daily. Currently in a 7-8/10 pain.   Leg Pain Associated symptoms: back pain   Fall  Back Pain Associated symptoms: leg pain     Past Medical History:  Diagnosis Date   Allergic rhinitis    Allergy    Anxiety    been on Tranxene long term, qd to BID   Basal cell carcinoma of nose    C7 radiculopathy    Degenerative cervical disc    Elevated PSA    Former smoker    GERD (gastroesophageal reflux disease)    Guillain-Barre syndrome ([place]) 1995   Hypertension    Malignant neoplasm of prostate ([place])    Mixed dyslipidemia    Obesity    Paresthesia of foot    left great toe since Guillian Barre   Vitamin D deficiency     Patient Active Problem List   Diagnosis Date Noted   Cervical radiculopathy 07/06/2021   Sensorineural hearing loss (SNHL) of right ear with unrestricted hearing of left ear 07/06/2021   Encounter for health maintenance examination in adult 04/04/2016   History of Guillain-Barre syndrome 04/04/2016   Special screening for malignant neoplasms, colon 04/04/2016   Burning sensation of the foot 04/04/2016   Gastroesophageal reflux disease without esophagitis 04/04/2016   Anxiety 12/31/2015   Paresthesia of right upper extremity 12/31/2015    Decreased ROM of neck 12/31/2015   Neck pain 12/31/2015   Vitamin D deficiency 04/29/2015   Essential hypertension 04/29/2015   Influenza vaccination declined by patient 09/22/2014   Obesity 09/22/2014   Generalized anxiety disorder 09/22/2014   Mixed dyslipidemia 09/22/2014   ALLERGIC RHINITIS 11/13/2009    Past Surgical History:  Procedure Laterality Date   COLONOSCOPY  2006   PROSTATECTOMY  2017   TONSILLECTOMY         Home Medications    Prior to Admission medications   Medication Sig Start Date End Date Taking? Authorizing Provider  HYDROcodone-acetaminophen (NORCO/VICODIN) 5-325 MG tablet Take 1 tablet by mouth every 6 (six) hours as needed for up to 3 days for moderate pain or severe pain. 11/10/22 11/13/22 Yes Ezrie Bunyan L, PA  Ascorbic Acid (VITAMIN C PO) Take 1,000 mg by mouth daily.     [provider]  aspirin 81 MG tablet Take 1 tablet (81 mg total) by mouth daily. 04/05/16   Tysinger, Camelia Eng, PA-C  augmented betamethasone dipropionate (DIPROLENE-AF) 0.05 % ointment Apply 1 application topically 2 (two) times daily. Apply to hands    [provider]  carvedilol (COREG) 25 MG tablet Take 25 mg by mouth 2 (two) times daily with a meal.  [provider]  clorazepate (TRANXENE) 7.5 MG tablet TAKE ONE TABLET BY MOUTH TWICE DAILY AS NEEDED FOR ANXIETY 09/12/16   Tysinger, Camelia Eng, PA-C  Cyanocobalamin (VITAMIN B-12 PO) Take 2,000 mcg by mouth daily.    [provider]  escitalopram (LEXAPRO) 20 MG tablet Take 20 mg by mouth daily.    [provider]  metFORMIN (GLUCOPHAGE-XR) 500 MG 24 hr tablet Take 500 mg by mouth daily with breakfast.    [provider]  Multiple Vitamin (MULTIVITAMIN) tablet Take 1 tablet by mouth daily.      [provider]  pregabalin (LYRICA) 150 MG capsule TAKE 1 CAPSULE(150 MG) BY MOUTH TWICE DAILY 09/26/22   Sater, Nanine Means, MD  simvastatin (ZOCOR) 20 MG tablet Take 20 mg by mouth  at bedtime.    [provider]  Vitamin D, Ergocalciferol, (DRISDOL) 50000 units CAPS capsule Take 1 capsule (50,000 Units total) by mouth every 7 (seven) days. 04/05/16   Tysinger, Camelia Eng, PA-C    Family History Family History  Problem Relation Age of Onset   Heart disease Mother 3       aortic valve replacement, CABG 2 vessel   Dementia Mother    Cancer Sister        lymphoma twice, childhood and now as adult   Arthritis Other    Hyperlipidemia Other     Social History Social History   Tobacco Use   Smoking status: Former    Types: Cigarettes    Quit date: 2000    Years since quitting: 24.0   Smokeless tobacco: Never  Substance Use Topics   Alcohol use: Yes    Alcohol/week: 1.0 standard drink of alcohol    Types: 1 Cans of beer per week    Comment: 2 shots/day   Drug use: Never     Allergies   Lipitor [atorvastatin] and Niacin and related   Review of Systems Review of Systems  Musculoskeletal:  Positive for back pain.  As per HPI   Physical Exam Triage Vital Signs ED Triage Vitals  Enc Vitals Group     BP 11/10/22 1610 (!) 169/83     Pulse Rate 11/10/22 1610 82     Resp 11/10/22 1610 18     Temp 11/10/22 1610 98.3 F (36.8 C)     Temp Source 11/10/22 1610 Oral     SpO2 11/10/22 1610 98 %     Weight --      Height --      Head Circumference --      Peak Flow --      Pain Score 11/10/22 1613 9     Pain Loc --      Pain Edu? --      Excl. in [place]? --    No data found.  Updated Vital Signs BP (!) 169/83 (BP Location: Left Arm)   Pulse 82   Temp 98.3 F (36.8 C) (Oral)   Resp 18   SpO2 98%   Visual Acuity Right Eye Distance:   Left Eye Distance:   Bilateral Distance:    Right Eye Near:   Left Eye Near:    Bilateral Near:     Physical Exam Vitals and nursing note reviewed.  Constitutional:      Appearance: Normal appearance. He is normal weight. He is not toxic-appearing or diaphoretic.     Comments: Moderate discomfort, pt  sitting on exam table, leaning to R  HENT:  Head: Normocephalic and atraumatic.  Cardiovascular:     Rate and Rhythm: Normal rate.     Pulses: Normal pulses.  Pulmonary:     Effort: Pulmonary effort is normal. No respiratory distress.     Breath sounds: Normal breath sounds. No stridor. No wheezing, rhonchi or rales.  Chest:     Chest wall: Tenderness (reproducible pain to palpation of L rib around) present.  Musculoskeletal:        General: Signs of injury (2 abrasions to L chest wall posteriorly- around T5 and just superior to iliac crest) present.  Skin:    General: Skin is warm and dry.     Findings: No erythema.  Neurological:     Mental Status: He is alert.      UC Treatments / Results  Labs (all labs ordered are listed, but only abnormal results are displayed) Labs Reviewed - No data to display  EKG   Radiology DG Ribs Unilateral W/Chest Left  Result Date: 11/10/2022 CLINICAL DATA:  Pain with deep inspiration in approximately the T5 region in the back and small abrasion and pain on the left side of the chest near the 9th, 10th, 11th and 12th ribs. EXAM: LEFT RIBS AND CHEST - 3+ VIEW COMPARISON:  Chest radiographs dated 06/24/2019 FINDINGS: Grossly stable normal sized heart. Mildly tortuous aorta. Interval subsegmental atelectasis at the right lung base and small to moderate-sized right pleural effusion. Interval minimally displaced left posterior 9th rib fracture. No pneumothorax. IMPRESSION: 1. Interval minimally displaced left posterior 9th rib fracture. 2. Interval small to moderate-sized right pleural effusion and subsegmental atelectasis at the right lung base. Electronically Signed   By: Claudie Revering M.D.   On: 11/10/2022 17:36    Procedures Procedures (including critical care time)  Medications Ordered in UC Medications  ketorolac (TORADOL) 30 MG/ML injection 30 mg (30 mg Intramuscular Given 11/10/22 1701)    Initial Impression / Assessment and Plan / UC  Course  I have reviewed the triage vital signs and the nursing notes.  Pertinent labs & imaging results that were available during my care of the patient were reviewed by me and considered in my medical decision making (see chart for details).     L sided 9th rib fracture -no signs of pneumothorax.  Moderate pain relief given with ketorolac injection in office.  Will discharge patient home with prescription for hydrocodone.  Recommended he follow-up with his PCP for further management.   Final Clinical Impressions(s) / UC Diagnoses   Final diagnoses:  Closed fracture of one rib of left side, initial encounter     Discharge Instructions      You have a minimally displaced rib fracture to your ninth rib on the left side. Please take the pain medication every 6-8 hours as needed for moderate to severe pain. Once the pain starts improving, you may change to Tylenol or ibuprofen. If you start developing severe pain or shortness of breath head to the emergency room. Please follow-up with your PCP within 1 week. Please read the attached handout     ED Prescriptions     Medication Sig Dispense Auth. Provider   HYDROcodone-acetaminophen (NORCO/VICODIN) 5-325 MG tablet Take 1 tablet by mouth every 6 (six) hours as needed for up to 3 days for moderate pain or severe pain. 12 tablet Brissa Asante L, [place]      I have reviewed the PDMP during this encounter.   Chaney Malling, [place] 11/10/22 1818
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
## 2022-11-10 NOTE — Discharge Instructions (Signed)
You have a minimally displaced rib fracture to your ninth rib on the left side. Please take the pain medication every 6-8 hours as needed for moderate to severe pain. Once the pain starts improving, you may change to Tylenol or ibuprofen. If you start developing severe pain or shortness of breath head to the emergency room. Please follow-up with your PCP within 1 week. Please read the attached handout

## 2023-03-22 ENCOUNTER — Ambulatory Visit: Payer: Medicare Other | Admitting: Neurology

## 2023-04-24 ENCOUNTER — Other Ambulatory Visit: Payer: Self-pay | Admitting: Neurology

## 2023-05-01 NOTE — Telephone Encounter (Signed)
Pt last seen on 03/16/22 No follow up scheduled  Last filled on 03/20/23 #60 tablets (30 day supply) Rx pending to be signed

## 2023-05-01 NOTE — Telephone Encounter (Signed)
Called pt and LVM for him to call back to r/s missed appt.

## 2023-05-03 NOTE — Telephone Encounter (Signed)
Appt scheduled 08-22-2023.

## 2023-05-03 NOTE — Telephone Encounter (Signed)
I called and spoke to Gordon, at [facility].  They do have 5 refills left from a 05-01-2023 prescription by internal medicine Daphane Shepherd PA.  Pt just picked up fill on 05-01-2023.

## 2023-06-29 ENCOUNTER — Encounter: Payer: Self-pay | Admitting: Neurology

## 2023-06-29 ENCOUNTER — Ambulatory Visit: Payer: Medicare Other | Admitting: Neurology

## 2023-06-29 VITALS — BP 163/93 | HR 81 | Ht 70.0 in | Wt 204.0 lb

## 2023-06-29 DIAGNOSIS — R208 Other disturbances of skin sensation: Secondary | ICD-10-CM

## 2023-06-29 DIAGNOSIS — Z8669 Personal history of other diseases of the nervous system and sense organs: Secondary | ICD-10-CM

## 2023-06-29 DIAGNOSIS — M542 Cervicalgia: Secondary | ICD-10-CM

## 2023-06-29 DIAGNOSIS — R39198 Other difficulties with micturition: Secondary | ICD-10-CM

## 2023-06-29 DIAGNOSIS — M5412 Radiculopathy, cervical region: Secondary | ICD-10-CM | POA: Diagnosis not present

## 2023-06-29 MED ORDER — PREGABALIN 150 MG PO CAPS: ORAL_CAPSULE | ORAL | 5 refills | Status: DC
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
## 2023-08-22 ENCOUNTER — Ambulatory Visit: Payer: Medicare Other | Admitting: Neurology
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
## 2024-01-22 ENCOUNTER — Ambulatory Visit: Payer: Medicare Other | Admitting: Adult Health

## 2024-01-22 ENCOUNTER — Encounter: Payer: Self-pay | Admitting: Adult Health

## 2024-01-22 VITALS — BP 138/78 | HR 77 | Ht 70.0 in | Wt 205.0 lb

## 2024-01-22 DIAGNOSIS — Z8669 Personal history of other diseases of the nervous system and sense organs: Secondary | ICD-10-CM

## 2024-01-22 DIAGNOSIS — M5412 Radiculopathy, cervical region: Secondary | ICD-10-CM | POA: Diagnosis not present

## 2024-01-22 DIAGNOSIS — R2689 Other abnormalities of gait and mobility: Secondary | ICD-10-CM

## 2024-01-22 NOTE — Patient Instructions (Addendum)
 Your Plan:  Continue Lyrica 150 mg twice daily  Would recommend restarting going to the gym routinely for routine exercise   Please call if you would like to do aquatic therapy which can help with your balance      Follow up in 1 year or call earlier if needed      Thank you for coming to see [place] at [facility]. I hope we have been able to provide you high quality care today.  You may receive a patient satisfaction survey over the next few weeks. We would appreciate your feedback and comments so that we may continue to improve ourselves and the health of our patients.

## 2024-08-08 ENCOUNTER — Other Ambulatory Visit: Payer: Self-pay | Admitting: Neurology

## 2024-08-09 MED ORDER — PREGABALIN 150 MG PO CAPS: ORAL_CAPSULE | ORAL | 5 refills | Status: AC

## 2024-08-09 NOTE — Addendum Note (Signed)
 Addended by: LOVENIA JESUSA SAUNDERS on: 08/09/2024 10:51 AM   Modules accepted: Orders

## 2025-01-21 ENCOUNTER — Ambulatory Visit: Admitting: Adult Health
